# Patient Record
Sex: Female | Born: 1990 | Race: White | Hispanic: No | Smoking: Current every day smoker
Health system: Southern US, Community
[De-identification: ages and names within clinical notes are randomized; demographics above are authoritative.]

## PROBLEM LIST (undated history)

## (undated) DIAGNOSIS — F1990 Other psychoactive substance use, unspecified, uncomplicated: Secondary | ICD-10-CM

## (undated) DIAGNOSIS — Z8709 Personal history of other diseases of the respiratory system: Secondary | ICD-10-CM

## (undated) DIAGNOSIS — D649 Anemia, unspecified: Secondary | ICD-10-CM

## (undated) DIAGNOSIS — Z87440 Personal history of urinary (tract) infections: Secondary | ICD-10-CM

## (undated) DIAGNOSIS — Z87898 Personal history of other specified conditions: Secondary | ICD-10-CM

## (undated) DIAGNOSIS — F909 Attention-deficit hyperactivity disorder, unspecified type: Secondary | ICD-10-CM

## (undated) HISTORY — PX: WISDOM TOOTH EXTRACTION: SHX21

## (undated) HISTORY — DX: Personal history of other diseases of the respiratory system: Z87.09

## (undated) HISTORY — DX: Personal history of other specified conditions: Z87.898

## (undated) HISTORY — DX: Personal history of urinary (tract) infections: Z87.440

---
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
## 2008-01-06 ENCOUNTER — Inpatient Hospital Stay (HOSPITAL_COMMUNITY): Admission: AD | Admit: 2008-01-06 | Discharge: 2008-01-06 | Payer: Self-pay | Admitting: Obstetrics and Gynecology

## 2008-01-21 ENCOUNTER — Emergency Department (HOSPITAL_COMMUNITY): Admission: EM | Admit: 2008-01-21 | Discharge: 2008-01-22 | Payer: Self-pay | Admitting: Emergency Medicine

## 2010-01-07 ENCOUNTER — Inpatient Hospital Stay (HOSPITAL_COMMUNITY): Admission: AD | Admit: 2010-01-07 | Discharge: 2010-01-07 | Payer: Self-pay | Admitting: Obstetrics and Gynecology

## 2010-01-09 ENCOUNTER — Inpatient Hospital Stay (HOSPITAL_COMMUNITY): Admission: AD | Admit: 2010-01-09 | Discharge: 2010-01-10 | Payer: Self-pay | Admitting: Obstetrics and Gynecology

## 2010-08-10 LAB — CBC
Hemoglobin: 10.4 g/dL — ABNORMAL LOW (ref 12.0–15.0)
MCH: 32 pg (ref 26.0–34.0)
MCH: 32.4 pg (ref 26.0–34.0)
MCHC: 34 g/dL (ref 30.0–36.0)
MCV: 93.6 fL (ref 78.0–100.0)
Platelets: 176 10*3/uL (ref 150–400)
Platelets: 192 10*3/uL (ref 150–400)
RBC: 3.84 MIL/uL — ABNORMAL LOW (ref 3.87–5.11)
RDW: 13 % (ref 11.5–15.5)
WBC: 9.7 10*3/uL (ref 4.0–10.5)

## 2010-08-10 LAB — RPR: RPR Ser Ql: NONREACTIVE

## 2010-10-01 ENCOUNTER — Emergency Department (HOSPITAL_COMMUNITY)
Admission: EM | Admit: 2010-10-01 | Discharge: 2010-10-01 | Disposition: A | Discharge status: Home / Self Care | Payer: Medicaid Other | Attending: Emergency Medicine | Admitting: Emergency Medicine

## 2010-10-01 ENCOUNTER — Emergency Department (HOSPITAL_COMMUNITY): Payer: Medicaid Other

## 2010-10-01 DIAGNOSIS — N12 Tubulo-interstitial nephritis, not specified as acute or chronic: Secondary | ICD-10-CM | POA: Insufficient documentation

## 2010-10-01 DIAGNOSIS — A499 Bacterial infection, unspecified: Secondary | ICD-10-CM | POA: Insufficient documentation

## 2010-10-01 DIAGNOSIS — R1031 Right lower quadrant pain: Secondary | ICD-10-CM | POA: Insufficient documentation

## 2010-10-01 DIAGNOSIS — N760 Acute vaginitis: Secondary | ICD-10-CM | POA: Insufficient documentation

## 2010-10-01 DIAGNOSIS — B9689 Other specified bacterial agents as the cause of diseases classified elsewhere: Secondary | ICD-10-CM | POA: Insufficient documentation

## 2010-10-01 LAB — POCT I-STAT, CHEM 8
BUN: 9 mg/dL (ref 6–23)
Chloride: 107 mEq/L (ref 96–112)
Creatinine, Ser: 0.8 mg/dL (ref 0.4–1.2)
Glucose, Bld: 95 mg/dL (ref 70–99)
Hemoglobin: 14.3 g/dL (ref 12.0–15.0)
Potassium: 4.3 mEq/L (ref 3.5–5.1)
Sodium: 139 mEq/L (ref 135–145)

## 2010-10-01 LAB — CBC
HCT: 40.3 % (ref 36.0–46.0)
Hemoglobin: 13.6 g/dL (ref 12.0–15.0)
MCHC: 33.7 g/dL (ref 30.0–36.0)
MCV: 88.4 fL (ref 78.0–100.0)

## 2010-10-01 LAB — WET PREP, GENITAL: Trich, Wet Prep: NONE SEEN

## 2010-10-01 LAB — URINALYSIS, ROUTINE W REFLEX MICROSCOPIC
Bilirubin Urine: NEGATIVE
Ketones, ur: NEGATIVE mg/dL
Nitrite: NEGATIVE
Protein, ur: NEGATIVE mg/dL
Urobilinogen, UA: 1 mg/dL (ref 0.0–1.0)

## 2010-10-01 LAB — DIFFERENTIAL
Basophils Absolute: 0 10*3/uL (ref 0.0–0.1)
Lymphocytes Relative: 12 % (ref 12–46)
Lymphs Abs: 2.1 10*3/uL (ref 0.7–4.0)
Monocytes Absolute: 0.9 10*3/uL (ref 0.1–1.0)
Neutro Abs: 15.2 10*3/uL — ABNORMAL HIGH (ref 1.7–7.7)

## 2010-10-01 LAB — POCT PREGNANCY, URINE: Preg Test, Ur: NEGATIVE

## 2010-10-01 MED ORDER — IOHEXOL 300 MG/ML  SOLN
100.0000 mL | Freq: Once | INTRAMUSCULAR | Status: AC | PRN
  Administered 2010-10-01: 100 mL via INTRAVENOUS

## 2010-10-02 LAB — GC/CHLAMYDIA PROBE AMP, GENITAL
Chlamydia, DNA Probe: NEGATIVE
GC Probe Amp, Genital: NEGATIVE
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
## 2011-02-22 LAB — URINALYSIS, ROUTINE W REFLEX MICROSCOPIC
Bilirubin Urine: NEGATIVE
Ketones, ur: NEGATIVE
Nitrite: POSITIVE — AB
Specific Gravity, Urine: 1.015
Urobilinogen, UA: 2 — ABNORMAL HIGH

## 2011-02-22 LAB — URINE CULTURE: Colony Count: >=100000

## 2011-02-22 LAB — CBC
MCHC: 33.3
MCV: 90.9
Platelets: 246
RDW: 12.9

## 2011-02-22 LAB — GC/CHLAMYDIA PROBE AMP, GENITAL: GC Probe Amp, Genital: NEGATIVE

## 2011-02-22 LAB — POCT PREGNANCY, URINE: Preg Test, Ur: NEGATIVE

## 2011-03-18 ENCOUNTER — Emergency Department (HOSPITAL_COMMUNITY)
Admission: EM | Admit: 2011-03-18 | Discharge: 2011-03-18 | Disposition: A | Discharge status: Home / Self Care | Payer: Medicaid Other | Attending: Emergency Medicine | Admitting: Emergency Medicine

## 2011-03-18 DIAGNOSIS — R110 Nausea: Secondary | ICD-10-CM | POA: Insufficient documentation

## 2011-03-18 DIAGNOSIS — N72 Inflammatory disease of cervix uteri: Secondary | ICD-10-CM | POA: Insufficient documentation

## 2011-03-18 DIAGNOSIS — R300 Dysuria: Secondary | ICD-10-CM | POA: Insufficient documentation

## 2011-03-18 LAB — URINALYSIS, ROUTINE W REFLEX MICROSCOPIC
Glucose, UA: NEGATIVE mg/dL
Protein, ur: NEGATIVE mg/dL
pH: 6.5 (ref 5.0–8.0)

## 2011-03-18 LAB — WET PREP, GENITAL

## 2011-03-18 LAB — URINE MICROSCOPIC-ADD ON

## 2013-03-16 ENCOUNTER — Encounter: Payer: Self-pay | Admitting: Family Medicine

## 2013-03-16 ENCOUNTER — Ambulatory Visit (INDEPENDENT_AMBULATORY_CARE_PROVIDER_SITE_OTHER): Payer: BC Managed Care – PPO | Admitting: Family Medicine

## 2013-03-16 VITALS — BP 104/68 | HR 62 | Temp 98.1°F | Ht 65.25 in | Wt 172.0 lb

## 2013-03-16 DIAGNOSIS — R1013 Epigastric pain: Secondary | ICD-10-CM

## 2013-03-16 DIAGNOSIS — Z23 Encounter for immunization: Secondary | ICD-10-CM

## 2013-03-16 DIAGNOSIS — R300 Dysuria: Secondary | ICD-10-CM | POA: Insufficient documentation

## 2013-03-16 DIAGNOSIS — N926 Irregular menstruation, unspecified: Secondary | ICD-10-CM

## 2013-03-16 LAB — POCT UA - MICROSCOPIC ONLY: Casts, Ur, LPF, POC: 0

## 2013-03-16 LAB — POCT URINALYSIS DIPSTICK: pH, UA: 7

## 2013-03-16 NOTE — Assessment & Plan Note (Signed)
desc as burning  Mild tenderness on exam Lab today incl cmet/cbc/ panc enzymes Trial of zantac 150 otc  Update  ? Consider abd [place] if not imp

## 2013-03-16 NOTE — Assessment & Plan Note (Signed)
Sounds like she has a short cycle - 21 days  Pt does not want to be on OC  Adv return to her gyn/midwife for annual exam  Neg u preg today

## 2013-03-16 NOTE — Patient Instructions (Addendum)
We will culture your urine and see if there is infection  Gradually cut out soda and drink more water  Start zantac 150 mg over the counter 1 pill twice daily for your upper abdominal pain  Labs today for abdominal pain Will update you with results and make a plan  Immunizations today - Tdap and a flu vaccine

## 2013-03-16 NOTE — Progress Notes (Signed)
Subjective:    Patient ID: Alicia Pruitt, female    DOB: 11/19/1990, 22 y.o.   MRN: [id number]  HPI Here to est care   Has obgyn- at [place] - had midwifes  Has not had insurance for a while   Last pap was about 3 years ago  No abn paps  Her mother had hx of ? Cervical cancer  No personal hx of std  She had the HPV shots in HS She does not want a pap or gyn exam today  Is not trying to get pregnant currently  Will in the future  Has been starting her period a week early for several months - and it was shorter than usual Urine preg test test was neg today  Using condoms for birth control -not interested in OC (and had iud - and it caused utis)  Has frequent urination - with urgency No dysuria A lot of abdominal pain - usually low in abdomen-now some burning in epigastric area  No change with eating  A lot of nausea but no vomiting (more so in the am)  No constipation or diarrhea or blood in stool   ? 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  Past Medical History  Diagnosis Date  . History of asthma     as a child  . History of headache   . History of hay fever   . History of UTI    No past surgical history on file. History  Substance Use Topics  . Smoking status: Former Games developer  . Smokeless tobacco: Former Neurosurgeon    Quit date: 09/12/2010  . Alcohol Use: Yes     Comment: occ   Family History  Problem Relation Age of Onset  . Alcohol abuse Paternal Grandfather   . Alcohol abuse Paternal Grandmother   . Drug abuse Other     maternal aunts had drug problems  . Arthritis Paternal  Grandfather     RA  . Colon cancer Other     maternal Great Aunt, Maternal [address]  . Breast cancer Maternal Grandmother   . Breast cancer Maternal Aunt   . Hypertension Maternal Grandmother   . Hypertension Father   . Diabetes type I Brother    No Known Allergies No current outpatient prescriptions on file prior to visit.   No current facility-administered medications on file prior to visit.    Review of Systems Review of Systems  Constitutional: Negative for fever, appetite change, fatigue and unexpected weight change.  Eyes: Negative for pain and visual disturbance.  Respiratory: Negative for cough and shortness of breath.   Cardiovascular: Negative for cp or palpitations    Gastrointestinal: Negative for nausea, diarrhea and constipation. pos for epigastric burning  Genitourinary: Negative for urgency pos for  frequency. neg for hematuria or flank pain neg for vag discharte  Skin: Negative for pallor or rash   Neurological: Negative for weakness, light-headedness, numbness and headaches.  Hematological: Negative for adenopathy. Does not bruise/bleed easily.  Psychiatric/Behavioral: Negative for dysphoric mood. The patient is not nervous/anxious.         Objective:   Physical Exam  Constitutional: She appears well-developed and well-nourished. No distress.  overwt and well appearing   HENT:  Head: Normocephalic and atraumatic.  Right Ear: External ear normal.  Left Ear: External ear normal.  Nose: Nose normal.  Mouth/Throat: Oropharynx is clear and moist.  Nares are boggy  Eyes: Conjunctivae and EOM are normal. Pupils are equal, round, and reactive to light. Right eye exhibits no discharge. Left eye exhibits no discharge. No scleral icterus.  Neck: Normal range of motion. Neck supple. No JVD present. No thyromegaly present.  Cardiovascular: Normal rate, regular rhythm, normal heart sounds and intact distal pulses.  Exam reveals no gallop.   Pulmonary/Chest: Effort  normal and breath sounds normal. No respiratory distress. She has no wheezes. She has no rales.  Abdominal: Soft. Bowel sounds are normal. She exhibits no distension and no mass. There is no hepatosplenomegaly. There is tenderness in the epigastric area. There is no rigidity, no rebound, no guarding, no CVA tenderness, no tenderness at McBurney's point and negative Murphy's sign.  Very mild epigastric tenderness  No suprapubic tenderness or fullness    Musculoskeletal: She exhibits no edema and no tenderness.  Lymphadenopathy:    She has no cervical adenopathy.  Neurological: She is alert. She has normal reflexes. No cranial nerve deficit. She exhibits normal muscle tone. Coordination normal.  Skin: Skin is warm and dry. No rash noted. No erythema. No pallor.  Psychiatric: She has a normal mood and affect.          Assessment & Plan:

## 2013-03-16 NOTE — Assessment & Plan Note (Signed)
ua is borderline for some rbc and wbc  Sent for culture Adv to gradually get off soda and increase water intake  Pend cx result

## 2013-03-17 LAB — COMPREHENSIVE METABOLIC PANEL
AST: 15 U/L (ref 0–37)
Alkaline Phosphatase: 63 U/L (ref 39–117)
BUN: 10 mg/dL (ref 6–23)
Calcium: 9.9 mg/dL (ref 8.4–10.5)
Creatinine, Ser: 0.8 mg/dL (ref 0.4–1.2)
Sodium: 141 mEq/L (ref 135–145)

## 2013-03-17 LAB — CBC WITH DIFFERENTIAL/PLATELET
Basophils Relative: 0.3 % (ref 0.0–3.0)
Eosinophils Absolute: 0.1 10*3/uL (ref 0.0–0.7)
Hemoglobin: 14 g/dL (ref 12.0–15.0)
MCHC: 33.9 g/dL (ref 30.0–36.0)
MCV: 91 fl (ref 78.0–100.0)
Monocytes Absolute: 0.4 10*3/uL (ref 0.1–1.0)
Monocytes Relative: 4.7 % (ref 3.0–12.0)
Neutro Abs: 5.7 10*3/uL (ref 1.4–7.7)
RBC: 4.54 Mil/uL (ref 3.87–5.11)
WBC: 9 10*3/uL (ref 4.5–10.5)

## 2013-03-17 LAB — LIPASE: Lipase: 26 U/L (ref 11.0–59.0)

## 2013-03-18 LAB — URINE CULTURE

## 2013-03-19 ENCOUNTER — Telehealth: Payer: Self-pay | Admitting: Family Medicine

## 2013-03-19 DIAGNOSIS — R300 Dysuria: Secondary | ICD-10-CM

## 2013-03-19 NOTE — Telephone Encounter (Signed)
Message copied by Judy Pimple on Fri Mar 19, 2013  1:35 PM ------      Message from: Shon Millet      Created: Fri Mar 19, 2013 10:32 AM       Pt notified of lab results and urine cx. Pt said she is still having stomach pain and the sensation that she has to urinate but when she uses the bathroom it's very little coming out, sxs are about the same as when she came to her appt, please advise ------

## 2013-03-19 NOTE — Telephone Encounter (Signed)
Ok- I am going to do a urology ref for her so they can try to find out what is causing all this Drink lots of water and avoid other beverages  Ref done- she will get a call

## 2013-03-19 NOTE — Telephone Encounter (Signed)
Pt advised referral done and is okay with seeing urologist, I advise pt Shirlee Limerick will call to get appt scheduled

## 2014-05-06 ENCOUNTER — Encounter: Payer: BC Managed Care – PPO | Admitting: Internal Medicine

## 2014-08-10 ENCOUNTER — Ambulatory Visit (INDEPENDENT_AMBULATORY_CARE_PROVIDER_SITE_OTHER)
Admission: RE | Admit: 2014-08-10 | Discharge: 2014-08-10 | Disposition: A | Discharge status: Home / Self Care | Payer: BLUE CROSS/BLUE SHIELD | Source: Ambulatory Visit | Attending: Family Medicine | Admitting: Family Medicine

## 2014-08-10 ENCOUNTER — Telehealth: Payer: Self-pay

## 2014-08-10 ENCOUNTER — Ambulatory Visit (INDEPENDENT_AMBULATORY_CARE_PROVIDER_SITE_OTHER): Payer: BLUE CROSS/BLUE SHIELD | Admitting: Family Medicine

## 2014-08-10 ENCOUNTER — Encounter: Payer: Self-pay | Admitting: Family Medicine

## 2014-08-10 VITALS — BP 112/70 | HR 73 | Temp 98.4°F | Ht 65.25 in | Wt 177.4 lb

## 2014-08-10 DIAGNOSIS — R05 Cough: Secondary | ICD-10-CM

## 2014-08-10 DIAGNOSIS — R059 Cough, unspecified: Secondary | ICD-10-CM

## 2014-08-10 DIAGNOSIS — R0789 Other chest pain: Secondary | ICD-10-CM

## 2014-08-10 MED ORDER — HYDROCODONE-ACETAMINOPHEN 5-325 MG PO TABS
1.0000 | ORAL_TABLET | Freq: Four times a day (QID) | ORAL | Status: DC | PRN
Start: 2014-08-10 — End: 2015-07-05

## 2014-08-10 MED ORDER — NAPROXEN 500 MG PO TABS: 500.0000 mg | ORAL_TABLET | Freq: Two times a day (BID) | ORAL | Status: DC

## 2014-08-10 NOTE — Assessment & Plan Note (Signed)
This is improving from a prior illness  Could have lead to cwp /costochondritis  cxr today  Update if worse  Disc sympt control

## 2014-08-10 NOTE — Assessment & Plan Note (Addendum)
Positional and with deep breaths and also recent cough  xr of ribs and chest  Naproxen 500 bid with food prn vicodin prn severe pain -warned of sedation  Warm/cool compresses Will adv further after films are read   In light of nausea- also disc poss gallbladder symptoms - doubtful but will keep in mind if no imp/ consider [place]  Of note-neg urine preg tests at home and just finished menses (using condoms for contraception)

## 2014-08-10 NOTE — Telephone Encounter (Signed)
Patient aware of xray results and recommendations. 

## 2014-08-10 NOTE — Progress Notes (Signed)
Subjective:    Patient ID: Alicia Pruitt, female    DOB: 03/24/1991, 24 y.o.   MRN: [id number]  HPI Here for 3 weeks of pain under R breast  Hurts to take deep breath or turn over in bed  Is tender to the touch  It radiates to her back on that side   Also had a cough while out of town - in Jan - still coughing a little  Prod of green phlegm  No fever  No sob  No other resp symptoms   A little nausea - a month  Neg preg tests are neg   Has a bump on her back that looks like a pimple   Tried motrin 400  Helped a bit  Took one of her grandmother's vicodin -helpful but does not like to take it   Patient Active Problem List   Diagnosis Date Noted  . Right-sided chest wall pain 08/10/2014  . Cough 08/10/2014  . Dysuria 03/16/2013  . Abdominal pain, epigastric 03/16/2013  . Irregular menses 03/16/2013   Past Medical History  Diagnosis Date  . History of asthma     as a child  . History of headache   . History of hay fever   . History of UTI    No past surgical history on file. History  Substance Use Topics  . Smoking status: Former Games developermoker  . Smokeless tobacco: Former NeurosurgeonUser    Quit date: 09/12/2010  . Alcohol Use: Yes     Comment: occ   Family History  Problem Relation Age of Onset  . Alcohol abuse Paternal Grandfather   . Alcohol abuse Paternal Grandmother   . Drug abuse Other     maternal aunts had drug problems  . Arthritis Paternal Grandfather     RA  . Colon cancer Other     maternal Great Aunt, Maternal [address] Uncle  . Breast cancer Maternal Grandmother   . Breast cancer Maternal Aunt   . Hypertension Maternal Grandmother   . Hypertension Father   . Diabetes type I Brother    Allergies  Allergen Reactions  . Tramadol Nausea And Vomiting   Current Outpatient Prescriptions on File Prior to Visit  Medication Sig Dispense Refill  . Multiple Vitamins-Minerals (MULTIVITAL) tablet Take 1 tablet by mouth daily.     No current facility-administered  medications on file prior to visit.     Review of Systems Review of Systems  Constitutional: Negative for fever, appetite change, fatigue and unexpected weight change.  Eyes: Negative for pain and visual disturbance.  Respiratory: Negative for wheeze  and shortness of breath.  pos for R sided chest wall/rib pain  Cardiovascular: Negative for cp or palpitations    Gastrointestinal: Negative for , diarrhea and constipation. neg for epigastric pain or vomiting  Genitourinary: Negative for urgency and frequency. neg for hematuria  Skin: Negative for pallor or rash   Neurological: Negative for weakness, light-headedness, numbness and headaches.  Hematological: Negative for adenopathy. Does not bruise/bleed easily.  Psychiatric/Behavioral: Negative for dysphoric mood. The patient is not nervous/anxious.         Objective:   Physical Exam  Constitutional: She appears well-developed and well-nourished. No distress.  overwt and well app  HENT:  Head: Normocephalic and atraumatic.  Mouth/Throat: Oropharynx is clear and moist.  Eyes: Conjunctivae and EOM are normal. Pupils are equal, round, and reactive to light. Right eye exhibits no discharge. Left eye exhibits no discharge. No scleral icterus.  Neck: Normal  range of motion. Neck supple. No JVD present.  Cardiovascular: Normal rate, regular rhythm, normal heart sounds and intact distal pulses.   Pulmonary/Chest: Effort normal and breath sounds normal. No respiratory distress. She has no wheezes. She has no rales. She exhibits tenderness.  R anterolat cwp - worse in lower ribs No crepitus or skin change   Pt does have pain with twisting and also deep breath   Abdominal: Soft. Bowel sounds are normal. She exhibits no distension and no mass. There is tenderness. There is no rebound and no guarding.  Mild tenderness under R ribs (but mostly on top of ribs) No epigastric tenderness No cva tenderness   Musculoskeletal: She exhibits no edema or  tenderness.  Lymphadenopathy:    She has no cervical adenopathy.  Neurological: She is alert.  Skin: Skin is warm and dry. No rash noted. No erythema. No pallor.  Resolving area of ecchymosis in L scapular area   Papule on mid low back-unremarkable   Psychiatric: She has a normal mood and affect.          Assessment & Plan:   Problem List Items Addressed This Visit      Other   Cough    This is improving from a prior illness  Could have lead to cwp /costochondritis  cxr today  Update if worse  Disc sympt control       Relevant Orders   DG Chest 2 View   DG Ribs Unilateral Right   Right-sided chest wall pain - Primary    Positional and with deep breaths and also recent cough  xr of ribs and chest  Naproxen 500 bid with food prn vicodin prn severe pain -warned of sedation  Warm/cool compresses Will adv further after films are read   In light of nausea- also disc poss gallbladder symptoms - doubtful but will keep in mind if no imp/ consider [place]  Of note-neg urine preg tests at home and just finished menses (using condoms for contraception)      Relevant Orders   DG Chest 2 View   DG Ribs Unilateral Right

## 2014-08-10 NOTE — Patient Instructions (Signed)
Chest xray today  Also rib xray  We will contact you with a result later today  Take the naproxen as needed for pain with food vicodin for severe breakthrough pain only-watch out for sedation    Update if not starting to improve in a week or if worsening

## 2014-08-10 NOTE — Telephone Encounter (Signed)
-----   Message from Judy PimpleMarne A Tower, MD sent at 08/10/2014  3:10 PM EDT ----- CXR and rib films are normal  So this is probably muscle strain or cartilage inflammation between ribs  Try the naproxen and update me if no improvement in the next week  Also alternate heat and cold compresses

## 2014-08-10 NOTE — Progress Notes (Signed)
Pre visit review using our clinic review tool, if applicable. No additional management support is needed unless otherwise documented below in the visit note. 

## 2015-07-05 ENCOUNTER — Encounter: Payer: Self-pay | Admitting: Family Medicine

## 2015-07-05 ENCOUNTER — Other Ambulatory Visit (HOSPITAL_COMMUNITY)
Admission: RE | Admit: 2015-07-05 | Discharge: 2015-07-05 | Disposition: A | Discharge status: Home / Self Care | Payer: BLUE CROSS/BLUE SHIELD | Source: Ambulatory Visit | Attending: Family Medicine | Admitting: Family Medicine

## 2015-07-05 ENCOUNTER — Ambulatory Visit (INDEPENDENT_AMBULATORY_CARE_PROVIDER_SITE_OTHER): Payer: BLUE CROSS/BLUE SHIELD | Admitting: Family Medicine

## 2015-07-05 VITALS — BP 106/62 | HR 66 | Temp 98.3°F | Ht 65.25 in | Wt 177.5 lb

## 2015-07-05 DIAGNOSIS — Z23 Encounter for immunization: Secondary | ICD-10-CM | POA: Diagnosis not present

## 2015-07-05 DIAGNOSIS — Z01419 Encounter for gynecological examination (general) (routine) without abnormal findings: Secondary | ICD-10-CM | POA: Insufficient documentation

## 2015-07-05 DIAGNOSIS — N920 Excessive and frequent menstruation with regular cycle: Secondary | ICD-10-CM | POA: Diagnosis not present

## 2015-07-05 DIAGNOSIS — Z720 Tobacco use: Secondary | ICD-10-CM

## 2015-07-05 DIAGNOSIS — R5383 Other fatigue: Secondary | ICD-10-CM | POA: Insufficient documentation

## 2015-07-05 DIAGNOSIS — F17200 Nicotine dependence, unspecified, uncomplicated: Secondary | ICD-10-CM

## 2015-07-05 DIAGNOSIS — R5382 Chronic fatigue, unspecified: Secondary | ICD-10-CM

## 2015-07-05 LAB — CBC WITH DIFFERENTIAL/PLATELET
Basophils Absolute: 0 10*3/uL (ref 0.0–0.1)
Basophils Relative: 0.4 % (ref 0.0–3.0)
Eosinophils Absolute: 0.1 10*3/uL (ref 0.0–0.7)
Eosinophils Relative: 1.1 % (ref 0.0–5.0)
HCT: 42.5 % (ref 36.0–46.0)
Hemoglobin: 14.1 g/dL (ref 12.0–15.0)
Lymphocytes Relative: 32.4 % (ref 12.0–46.0)
Lymphs Abs: 3 10*3/uL (ref 0.7–4.0)
MCHC: 33 g/dL (ref 30.0–36.0)
MCV: 92 fl (ref 78.0–100.0)
Monocytes Absolute: 0.6 10*3/uL (ref 0.1–1.0)
Monocytes Relative: 6.8 % (ref 3.0–12.0)
Neutro Abs: 5.4 10*3/uL (ref 1.4–7.7)
Neutrophils Relative %: 59.3 % (ref 43.0–77.0)
Platelets: 252 10*3/uL (ref 150.0–400.0)
RBC: 4.62 Mil/uL (ref 3.87–5.11)
RDW: 13.5 % (ref 11.5–15.5)
WBC: 9.1 10*3/uL (ref 4.0–10.5)

## 2015-07-05 LAB — HCG, SERUM, QUALITATIVE: Preg, Serum: NEGATIVE

## 2015-07-05 LAB — TSH: TSH: 0.48 u[IU]/mL (ref 0.35–4.50)

## 2015-07-05 LAB — HM PAP SMEAR: HM Pap smear: NORMAL

## 2015-07-05 NOTE — Progress Notes (Signed)
Subjective:    Patient ID: Alicia Pruitt, female    DOB: [birth year], 25 y.o.   MRN: [id number]  HPI Here with troublesome periods   Last 2 mo - period is short (lasting 2 d)  Blood is clotted on the tampon - very heavy compared to normal --using regular size tampon - and changes 2 times in an hour  Unusual for her   Last birth was 5 years ago   Last gyn visit and pap was years ago - at least 3 years ago  No hx of gyn problems  No abn pap tests  Does not use birth control - seeing what happens  No condoms Monogamous-no concerns about stds   2 kids   Still smokes - 3-4 cig per day  She does want to quit smoking -she is not quite ready   Wt has not changed   Had an IUD in the past - worked ok but then it seemed to predispose her to utis   Is spotting right now   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 Smoking 07/06/2015  . Heavy menses 07/05/2015  . Encounter for routine gynecological examination 07/05/2015  . Fatigue 07/05/2015  . Right-sided chest wall pain 08/10/2014  . Cough 08/10/2014  . Dysuria 03/16/2013  . Abdominal pain, epigastric 03/16/2013  . Irregular menses 03/16/2013   Past Medical History  Diagnosis Date  . History of asthma     as a child  . History of headache   . History of hay fever   . History of UTI    No past surgical history on file. Social History  Substance Use Topics  . Smoking status: Current Every Day Smoker -- 0.10 packs/day    Types: Cigarettes  . Smokeless tobacco: Never Used  . Alcohol Use: 0.0 oz/week    0 Standard drinks or equivalent per week     Comment: occ   Family History  Problem Relation Age of Onset  . Alcohol abuse Paternal Grandfather   . Alcohol abuse Paternal Grandmother   . Drug abuse Other     maternal aunts had drug problems  . Arthritis  Paternal Grandfather     RA  . Colon cancer Other     maternal Great Aunt, Maternal [address]  . Breast cancer Maternal Grandmother   . Breast cancer Maternal Aunt   . Hypertension Maternal Grandmother   . Hypertension Father   . Diabetes type I Brother    Allergies  Allergen Reactions  . Tramadol Nausea And Vomiting   Current Outpatient Prescriptions on File Prior to Visit  Medication Sig Dispense Refill  . Multiple Vitamins-Minerals (MULTIVITAL) tablet Take 1 tablet by mouth daily.    . Probiotic Product (PROBIOTIC DAILY PO) Take by mouth.     No current facility-administered medications on file prior to visit.    Review of Systems Review of Systems  Constitutional: Negative for fever, appetite change, fatigue and unexpected weight change.  Eyes: Negative for pain and visual disturbance.  Respiratory: Negative for cough and shortness of breath.   Cardiovascular: Negative for cp or palpitations    Gastrointestinal: Negative for nausea, diarrhea and constipation.  Genitourinary: Negative for urgency and frequency. pos for change in menses  Skin: Negative for pallor or rash   Neurological:  Negative for weakness, light-headedness, numbness and headaches.  Hematological: Negative for adenopathy. Does not bruise/bleed easily.  Psychiatric/Behavioral: Negative for dysphoric mood. The patient is not nervous/anxious.  pos for moodiness lately        Objective:   Physical Exam  Constitutional: She appears well-developed and well-nourished. No distress.  obese and well appearing   HENT:  Head: Normocephalic and atraumatic.  Right Ear: External ear normal.  Left Ear: External ear normal.  Mouth/Throat: Oropharynx is clear and moist.  Eyes: Conjunctivae and EOM are normal. Pupils are equal, round, and reactive to light. No scleral icterus.  Neck: Normal range of motion. Neck supple. No JVD present. Carotid bruit is not present. No thyromegaly present.  Cardiovascular: Normal  rate, regular rhythm, normal heart sounds and intact distal pulses.  Exam reveals no gallop.   Pulmonary/Chest: Effort normal and breath sounds normal. No respiratory distress. She has no wheezes. She exhibits no tenderness.  Abdominal: Soft. Bowel sounds are normal. She exhibits no distension, no abdominal bruit and no mass. There is no tenderness.  Genitourinary: No breast swelling, tenderness, discharge or bleeding. There is no rash, tenderness or lesion on the right labia. There is no rash, tenderness or lesion on the left labia. Uterus is not enlarged and not tender. Cervix exhibits no motion tenderness and no friability. Right adnexum displays no mass, no tenderness and no fullness. Left adnexum displays no mass, no tenderness and no fullness. No erythema, tenderness or bleeding in the vagina.  Breast exam: No mass, nodules, thickening, tenderness, bulging, retraction, inflamation, nipple discharge or skin changes noted.  No axillary or clavicular LA.      Scant blood at cervical OS  Musculoskeletal: Normal range of motion. She exhibits no edema or tenderness.  Lymphadenopathy:    She has no cervical adenopathy.  Neurological: She is alert. She has normal reflexes. No cranial nerve deficit. She exhibits normal muscle tone. Coordination normal.  Skin: Skin is warm and dry. No rash noted. No erythema. No pallor.  Psychiatric: She has a normal mood and affect.          Assessment & Plan:   Problem List Items Addressed This Visit      Other   Encounter for routine gynecological examination    Pap and exam done  Menses changes disc  Does not desire contraception  Adv PNV if she is not using any      Relevant Orders   Cytology - PAP   Fatigue    In addn to change in menses Suspect change in schedule and stress  Lab today      Relevant Orders   TSH (Completed)   CBC with Differential/Platelet (Completed)   Heavy menses - Primary    Heavy but short  Also short cycle - has  had this in the past  Does not use contraception  Exam and pap today  Lab for cbc/tsh and serum pregnancy test   Suspect due to change in schedule and stress Enc self care  She could try OC to regulate menses if she quits smoking       Relevant Orders   hCG, serum, qualitative (Completed)   Smoking    Disc in detail risks of smoking and possible outcomes including copd, vascular/ heart disease, cancer , respiratory and sinus infections  Pt voices understanding She is a light smoker Not ready to quit currently  Offered help if needed        Other Visit Diagnoses  Need for influenza vaccination        Relevant Orders    Flu Vaccine QUAD 36+ mos PF IM (Fluarix & Fluzone Quad PF) (Completed)

## 2015-07-05 NOTE — Patient Instructions (Addendum)
Pap test and exam today  If you do quit smoking and are interested in oral contraception to control periods- let me know and we can try one  Try to keep sleep regular/ stress low / exercise regular and healthy diet  Take care of yourself   Lab today for thyroid and also a serum pregnancy test and blood count

## 2015-07-05 NOTE — Progress Notes (Signed)
Pre visit review using our clinic review tool, if applicable. No additional management support is needed unless otherwise documented below in the visit note. 

## 2015-07-06 DIAGNOSIS — IMO0001 Reserved for inherently not codable concepts without codable children: Secondary | ICD-10-CM | POA: Insufficient documentation

## 2015-07-06 DIAGNOSIS — F17200 Nicotine dependence, unspecified, uncomplicated: Secondary | ICD-10-CM | POA: Insufficient documentation

## 2015-07-06 NOTE — Assessment & Plan Note (Signed)
In addn to change in menses Suspect change in schedule and stress  Lab today

## 2015-07-06 NOTE — Assessment & Plan Note (Signed)
Disc in detail risks of smoking and possible outcomes including copd, vascular/ heart disease, cancer , respiratory and sinus infections  Pt voices understanding She is a light smoker Not ready to quit currently  Offered help if needed

## 2015-07-06 NOTE — Assessment & Plan Note (Signed)
Heavy but short  Also short cycle - has had this in the past  Does not use contraception  Exam and pap today  Lab for cbc/tsh and serum pregnancy test   Suspect due to change in schedule and stress Enc self care  She could try OC to regulate menses if she quits smoking

## 2015-07-06 NOTE — Assessment & Plan Note (Signed)
Pap and exam done  Menses changes disc  Does not desire contraception  Adv PNV if she is not using any

## 2015-07-10 LAB — CYTOLOGY - PAP

## 2015-07-11 ENCOUNTER — Encounter: Payer: Self-pay | Admitting: *Deleted

## 2015-07-21 IMAGING — CR DG RIBS 2V*R*
4 series · 4 of 4 positions shown · non-contrast
Comparison: 08/10/2014 chest x-ray

CLINICAL DATA: Right chest wall pain.  Cough.

EXAM:
RIGHT RIBS - 2 VIEW

[view not recorded (1 of 4)]
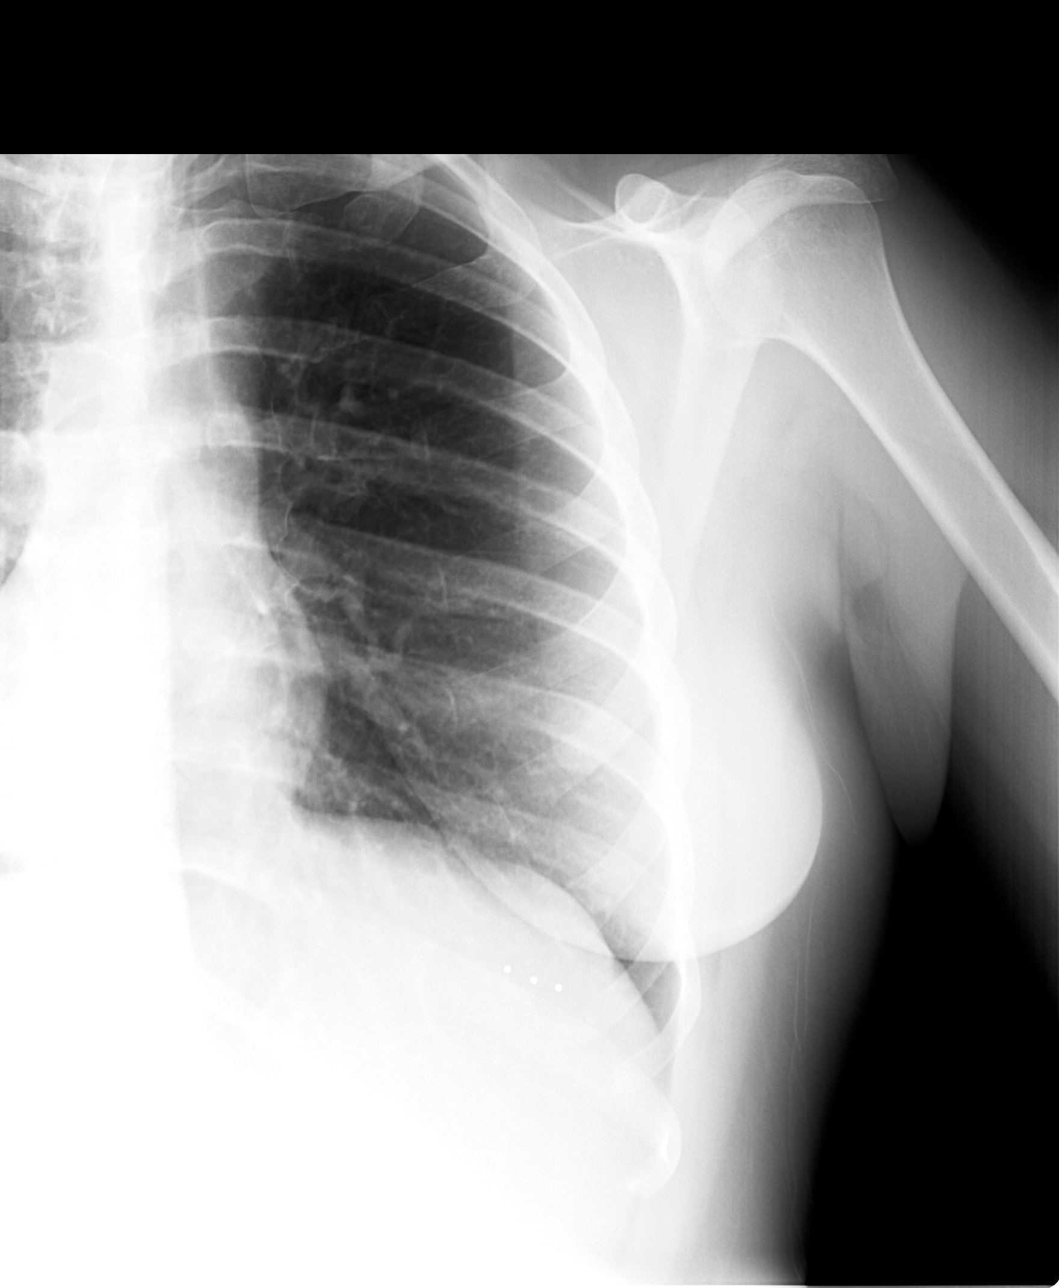

[view not recorded (2 of 4)]
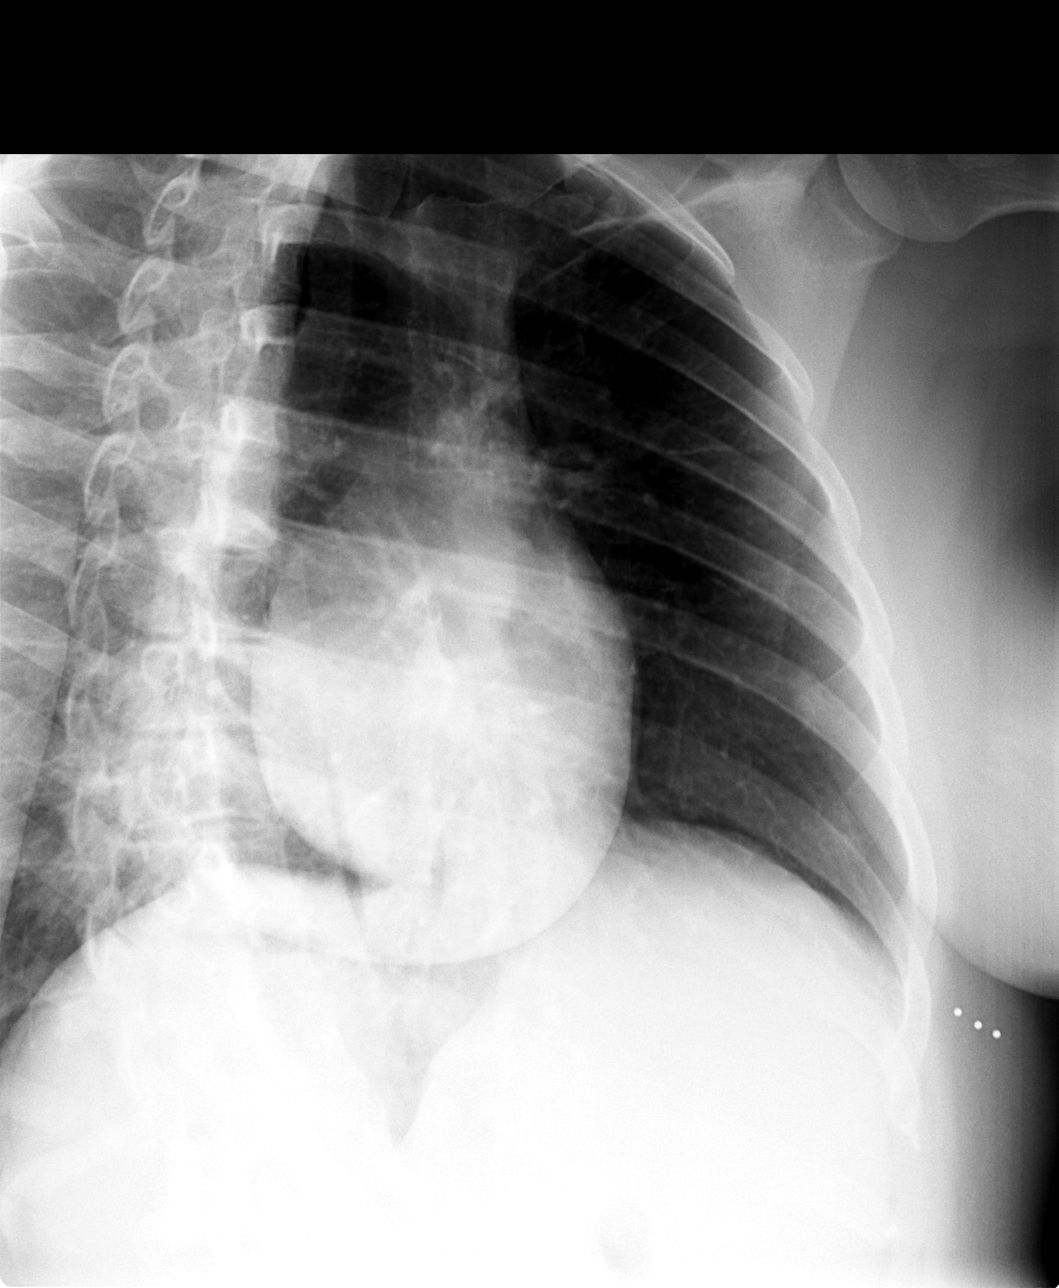

[view not recorded (3 of 4)]
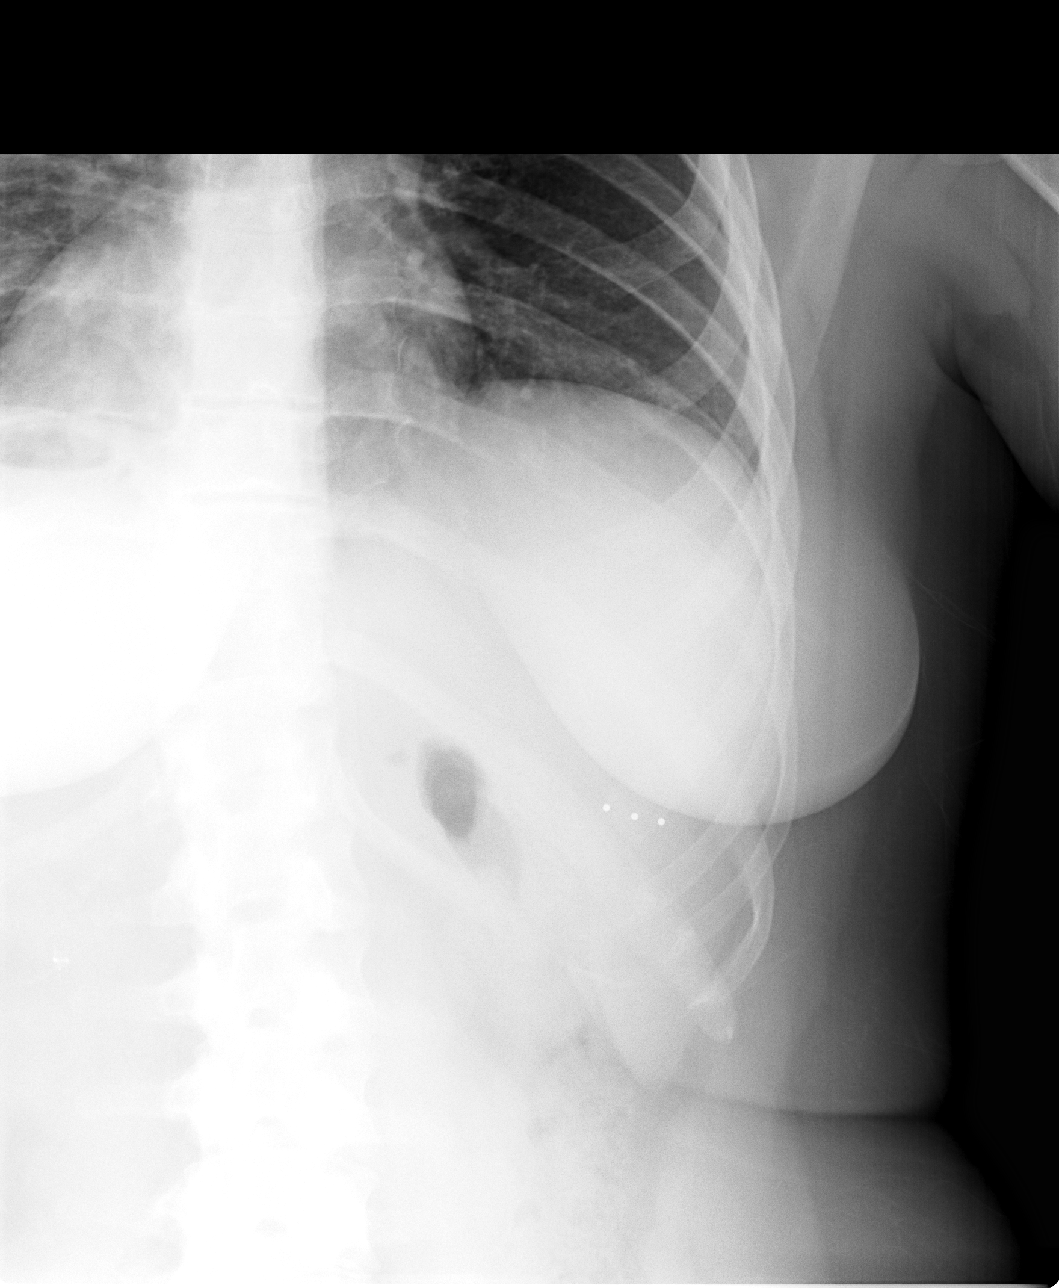

[view not recorded (4 of 4)]
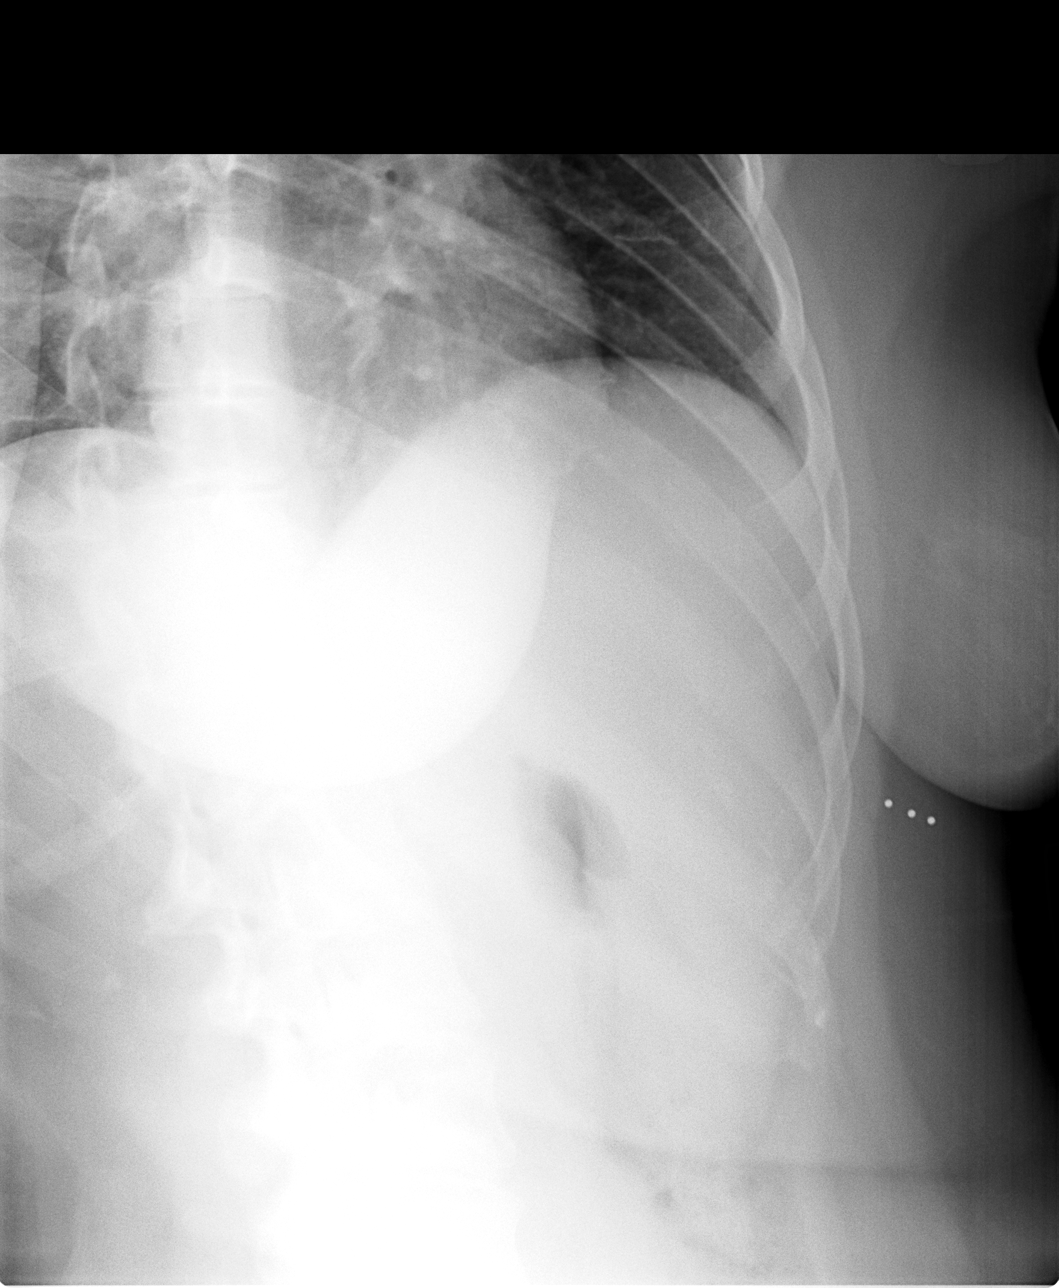

[4 of 4 positions shown; findings below may reference images not displayed]

FINDINGS: No fracture or other bone lesions are seen involving the ribs. Left
lung is clear. No pneumothorax.
IMPRESSION: Negative.

## 2017-12-25 ENCOUNTER — Emergency Department (HOSPITAL_COMMUNITY)
Admission: EM | Admit: 2017-12-25 | Discharge: 2017-12-25 | Disposition: A | Discharge status: Home / Self Care | Payer: BLUE CROSS/BLUE SHIELD | Attending: Emergency Medicine | Admitting: Emergency Medicine

## 2017-12-25 ENCOUNTER — Other Ambulatory Visit: Payer: Self-pay

## 2017-12-25 DIAGNOSIS — Z79899 Other long term (current) drug therapy: Secondary | ICD-10-CM | POA: Insufficient documentation

## 2017-12-25 DIAGNOSIS — F17210 Nicotine dependence, cigarettes, uncomplicated: Secondary | ICD-10-CM | POA: Insufficient documentation

## 2017-12-25 DIAGNOSIS — J45909 Unspecified asthma, uncomplicated: Secondary | ICD-10-CM | POA: Insufficient documentation

## 2017-12-25 DIAGNOSIS — K0889 Other specified disorders of teeth and supporting structures: Secondary | ICD-10-CM

## 2017-12-25 MED ORDER — PENICILLIN V POTASSIUM 500 MG PO TABS: 500.0000 mg | ORAL_TABLET | Freq: Three times a day (TID) | ORAL | 0 refills | Status: DC

## 2017-12-25 MED ORDER — GABAPENTIN 100 MG PO CAPS: 100.0000 mg | ORAL_CAPSULE | Freq: Three times a day (TID) | ORAL | 0 refills | Status: DC

## 2017-12-25 NOTE — ED Provider Notes (Signed)
[facility] EMERGENCY DEPARTMENT Provider Note   CSN: [id number] Arrival date & time: 12/25/17  2118     History   Chief Complaint Chief Complaint  Patient presents with  . Dental Pain    HPI Alicia Pruitt is a 27 y.o. female.  The history is provided by the patient. No language interpreter was used.  Dental Pain       27 year old female presented for evaluation of dental pain.  Patient report for the past 2 weeks she has had persistent pain to the right lower tooth.  She described pain as a achy sensation, moderate in severity, with associated facial swelling.  She report that a tooth that broke off shortly before her pain started.  In the meantime, patient have been taking her friend's leftover Bactrim for total of 6 doses.  She also took 1 pill of tramadol, and also to some gabapentin from her dad which did provide some relief.  She has been taking ibuprofen as well with minimal improvement.  She does not have dental care time.  She denies any specific injury.  No fever, trouble swallowing, or neck pain.    Past Medical History:  Diagnosis Date  . History of asthma    as a child  . History of hay fever   . History of headache   . History of UTI     Patient Active Problem List   Diagnosis Date Noted  . Smoking 07/06/2015  . Heavy menses 07/05/2015  . Encounter for routine gynecological examination 07/05/2015  . Fatigue 07/05/2015  . Right-sided chest wall pain 08/10/2014  . Cough 08/10/2014  . Dysuria 03/16/2013  . Abdominal pain, epigastric 03/16/2013  . Irregular menses 03/16/2013    The histories are not reviewed yet. Please review them in the "History" navigator section and refresh this SmartLink.   OB History   None      Home Medications    Prior to Admission medications   Medication Sig Start Date End Date Taking? Authorizing Provider  Multiple Vitamins-Minerals (MULTIVITAL) tablet Take 1 tablet by mouth daily.    [provider]  Probiotic Product (PROBIOTIC DAILY PO) Take by mouth.    [provider]    Family History Family History  Problem Relation Age of Onset  . Alcohol abuse Paternal Grandfather   . Alcohol abuse Paternal Grandmother   . Drug abuse Other        maternal aunts had drug problems  . Arthritis Paternal Grandfather        RA  . Colon cancer Other        maternal Great Aunt, Maternal [address]  . Breast cancer Maternal Grandmother   . Breast cancer Maternal Aunt   . Hypertension Maternal Grandmother   . Hypertension Father   . Diabetes type I Brother     Social History Social History   Tobacco Use  . Smoking status: Current Every Day Smoker    Packs/day: 0.10    Types: Cigarettes  . Smokeless tobacco: Never Used  Substance Use Topics  . Alcohol use: Yes    Alcohol/week: 0.0 oz    Comment: occ  . Drug use: No     Allergies   Tramadol   Review of Systems Review of Systems  Constitutional: Negative for fever.  HENT: Positive for dental problem. Negative for trouble swallowing.      Physical Exam Updated Vital Signs BP 133/84 (BP Location: Right Arm)  Pulse 96   Temp 98.2 F (36.8 C)   Ht 5\' 6"  (1.676 m)   Wt 72.6 kg (160 lb)   SpO2 98%   BMI 25.82 kg/m   Physical Exam  Constitutional: She appears well-developed and well-nourished. No distress.  HENT:  Head: Atraumatic.  Mouth: Dental decay noted to tooth #28 with tenderness to palpation no gingival involvement and no facial swelling appreciated.  Eyes: Conjunctivae are normal.  Neck: Neck supple.  Neurological: She is alert.  Skin: No rash noted.  Psychiatric: She has a normal mood and affect.  Nursing note and vitals reviewed.    ED Treatments / Results  Labs (all labs ordered are listed, but only abnormal results are displayed) Labs Reviewed - No data to display  EKG None  Radiology No results found.  Procedures Procedures (including critical care  time)  Medications Ordered in ED Medications - No data to display   Initial Impression / Assessment and Plan / ED Course  I have reviewed the triage vital signs and the nursing notes.  Pertinent labs & imaging results that were available during my care of the patient were reviewed by me and considered in my medical decision making (see chart for details).     BP 133/84 (BP Location: Right Arm)   Pulse 96   Temp 98.2 F (36.8 C)   Ht 5\' 6"  (1.676 m)   Wt 72.6 kg (160 lb)   SpO2 98%   BMI 25.82 kg/m    Final Clinical Impressions(s) / ED Diagnoses   Final diagnoses:  Pain, dental    ED Discharge Orders        Ordered    penicillin v potassium (VEETID) 500 MG tablet  3 times daily     12/25/17 2222    gabapentin (NEURONTIN) 100 MG capsule  3 times daily     12/25/17 2222     10:18 PM Patient here with dental pain.  No significant abscess appreciated.  No evidence of deep tissue infection.  Patient reports taking Bactrim recently but only had 3 days worth.  Will transition to penicillin.  Patient also reports gabapentin seems to help with her pain.  Will provide short course of gabapentin and will give dental referral.   Fayrene Helperran, Khiya Friese, PA-C 12/25/17 2222    Mesner, Barbara CowerJason, MD 12/26/17 [phone]

## 2017-12-25 NOTE — ED Triage Notes (Signed)
Per pt she has had a tooth that broke on the right lower bottom and has been hurting for 2 weeks. Pt stated her face was very swollen but had taken some ones antibiotics and it has made the swelling go down. Denies no fevers. Alert oriented x 4

## 2018-11-04 ENCOUNTER — Emergency Department (HOSPITAL_COMMUNITY)
Admission: EM | Admit: 2018-11-04 | Discharge: 2018-11-04 | Disposition: A | Discharge status: Home / Self Care | Payer: Self-pay | Attending: Emergency Medicine | Admitting: Emergency Medicine

## 2018-11-04 ENCOUNTER — Other Ambulatory Visit: Payer: Self-pay

## 2018-11-04 ENCOUNTER — Ambulatory Visit (HOSPITAL_COMMUNITY)
Admission: EM | Admit: 2018-11-04 | Discharge: 2018-11-04 | Disposition: A | Discharge status: Home / Self Care | Payer: No Typology Code available for payment source | Attending: Emergency Medicine | Admitting: Emergency Medicine

## 2018-11-04 ENCOUNTER — Encounter (HOSPITAL_COMMUNITY): Payer: Self-pay | Admitting: *Deleted

## 2018-11-04 DIAGNOSIS — J45909 Unspecified asthma, uncomplicated: Secondary | ICD-10-CM | POA: Insufficient documentation

## 2018-11-04 DIAGNOSIS — T7421XA Adult sexual abuse, confirmed, initial encounter: Secondary | ICD-10-CM | POA: Insufficient documentation

## 2018-11-04 DIAGNOSIS — F17210 Nicotine dependence, cigarettes, uncomplicated: Secondary | ICD-10-CM | POA: Insufficient documentation

## 2018-11-04 DIAGNOSIS — Z0441 Encounter for examination and observation following alleged adult rape: Secondary | ICD-10-CM | POA: Insufficient documentation

## 2018-11-04 DIAGNOSIS — Z79899 Other long term (current) drug therapy: Secondary | ICD-10-CM | POA: Insufficient documentation

## 2018-11-04 MED ORDER — CEFTRIAXONE SODIUM 250 MG IJ SOLR
250.0000 mg | Freq: Once | INTRAMUSCULAR | Status: AC
  Administered 2018-11-04: 250 mg via INTRAMUSCULAR
  Filled 2018-11-04: qty 250

## 2018-11-04 MED ORDER — AZITHROMYCIN 250 MG PO TABS
1000.0000 mg | ORAL_TABLET | Freq: Once | ORAL | Status: AC
  Administered 2018-11-04: 1000 mg via ORAL
  Filled 2018-11-04: qty 4

## 2018-11-04 MED ORDER — METRONIDAZOLE 500 MG PO TABS
2000.0000 mg | ORAL_TABLET | Freq: Once | ORAL | Status: AC
  Administered 2018-11-04: 2000 mg via ORAL
  Filled 2018-11-04: qty 4

## 2018-11-04 MED ORDER — LIDOCAINE HCL (PF) 1 % IJ SOLN
0.9000 mL | Freq: Once | INTRAMUSCULAR | Status: AC
  Administered 2018-11-04: 0.9 mL
  Filled 2018-11-04: qty 30

## 2018-11-04 MED ORDER — ONDANSETRON 8 MG PO TBDP: ORAL_TABLET | ORAL | 0 refills | Status: DC
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
## 2018-11-04 NOTE — ED Notes (Signed)
Bed: WA03 Expected date:  Expected time:  Means of arrival:  Comments: Closing section
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
## 2018-11-04 NOTE — ED Notes (Signed)
Called Sane RN Dawn@[phone] 
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
## 2018-11-04 NOTE — SANE Note (Signed)
FNE spoke to patient about STI prophylaxis including HIV prophylaxis.  Patient was informed of side effects and potential blood work that may be needed.  Patient opted for all STI prophylaxis except HIV.  FNE also spoke to patient about pregnancy prevention.  Patient opted not to take pregnancy prevention medication.   Patient is a daily drinker.  FNE explained to patient the adverse effects of mixing alcohol with metronidazole which is a standard STI proplylaxis medication.  Patient stated she could abstain from alcohol for at least 5 days.  As patient was intermittently vomiting during exam, FNE gave all tablets to patient to take at home.  Patient was instructed to take the tablets on Friday and not to partake of any alcohol until at least Monday, November 09, 2018.  Attending physician, Dr. Randal Buba provide patient with a prescription for Zofran to manage any nausea from the medications.

## 2019-02-16 ENCOUNTER — Encounter (HOSPITAL_COMMUNITY): Payer: Self-pay | Admitting: Emergency Medicine

## 2019-02-16 ENCOUNTER — Emergency Department (HOSPITAL_COMMUNITY)
Admission: EM | Admit: 2019-02-16 | Discharge: 2019-02-16 | Discharge status: Against Medical Advice | Payer: No Typology Code available for payment source | Attending: Emergency Medicine | Admitting: Emergency Medicine

## 2019-02-16 ENCOUNTER — Other Ambulatory Visit: Payer: Self-pay

## 2019-02-16 DIAGNOSIS — Z5321 Procedure and treatment not carried out due to patient leaving prior to being seen by health care provider: Secondary | ICD-10-CM | POA: Insufficient documentation

## 2019-02-16 DIAGNOSIS — Z20822 Contact with and (suspected) exposure to covid-19: Secondary | ICD-10-CM

## 2019-02-16 NOTE — ED Notes (Signed)
Called pt for triage no answer X3

## 2019-02-16 NOTE — ED Notes (Signed)
Called pt for triage no answer X3 

## 2019-02-16 NOTE — ED Triage Notes (Signed)
Pt here with possible covid , pt co worker just tested positive pt has loss of smell and taste and h/a , pt is [redacted] weeks pregnant

## 2019-02-17 LAB — NOVEL CORONAVIRUS, NAA: SARS-CoV-2, NAA: NOT DETECTED

## 2021-04-01 ENCOUNTER — Other Ambulatory Visit: Payer: Self-pay

## 2021-04-01 ENCOUNTER — Encounter (HOSPITAL_COMMUNITY): Payer: Self-pay | Admitting: Emergency Medicine

## 2021-04-01 ENCOUNTER — Emergency Department (HOSPITAL_COMMUNITY)
Admission: EM | Admit: 2021-04-01 | Discharge: 2021-04-01 | Discharge status: Against Medical Advice | Payer: Medicaid Other | Attending: Emergency Medicine | Admitting: Emergency Medicine

## 2021-04-01 DIAGNOSIS — R000 Tachycardia, unspecified: Secondary | ICD-10-CM | POA: Diagnosis not present

## 2021-04-01 DIAGNOSIS — T402X1A Poisoning by other opioids, accidental (unintentional), initial encounter: Secondary | ICD-10-CM | POA: Diagnosis present

## 2021-04-01 DIAGNOSIS — F17210 Nicotine dependence, cigarettes, uncomplicated: Secondary | ICD-10-CM | POA: Diagnosis not present

## 2021-04-01 DIAGNOSIS — T40601A Poisoning by unspecified narcotics, accidental (unintentional), initial encounter: Secondary | ICD-10-CM

## 2021-04-01 HISTORY — DX: Other psychoactive substance use, unspecified, uncomplicated: F19.90
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
## 2021-04-01 NOTE — ED Notes (Signed)
PT states she wants to leave. MD aware. IV removed.

## 2021-04-01 NOTE — Discharge Instructions (Signed)
You have chosen to leave the emergency department AGAINST MEDICAL ADVICE.  I have explained  the need for further evaluation/admission.  You have verbalized understanding of these results and plan and are choosing to leave the hospital AGAINST MEDICAL ADVICE. You understand the risks associated with leaving without further evaluation/treatment. If you have any worsening symptoms or choose to be treated please return immediately to emergency department.
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
## 2021-11-05 ENCOUNTER — Ambulatory Visit (INDEPENDENT_AMBULATORY_CARE_PROVIDER_SITE_OTHER): Payer: Medicaid Other

## 2021-11-05 ENCOUNTER — Encounter (HOSPITAL_COMMUNITY): Payer: Self-pay | Admitting: Emergency Medicine

## 2021-11-05 ENCOUNTER — Ambulatory Visit (HOSPITAL_COMMUNITY)
Admission: EM | Admit: 2021-11-05 | Discharge: 2021-11-05 | Disposition: A | Discharge status: Home / Self Care | Payer: Medicaid Other | Attending: Family Medicine | Admitting: Family Medicine

## 2021-11-05 DIAGNOSIS — K921 Melena: Secondary | ICD-10-CM | POA: Insufficient documentation

## 2021-11-05 DIAGNOSIS — K529 Noninfective gastroenteritis and colitis, unspecified: Secondary | ICD-10-CM | POA: Diagnosis not present

## 2021-11-05 DIAGNOSIS — S2242XA Multiple fractures of ribs, left side, initial encounter for closed fracture: Secondary | ICD-10-CM | POA: Diagnosis not present

## 2021-11-05 DIAGNOSIS — R0789 Other chest pain: Secondary | ICD-10-CM

## 2021-11-05 LAB — CBC
HCT: 33 % — ABNORMAL LOW (ref 36.0–46.0)
Hemoglobin: 10.8 g/dL — ABNORMAL LOW (ref 12.0–15.0)
MCH: 30.8 pg (ref 26.0–34.0)
MCHC: 32.7 g/dL (ref 30.0–36.0)
MCV: 94 fL (ref 80.0–100.0)
Platelets: 294 10*3/uL (ref 150–400)
RBC: 3.51 MIL/uL — ABNORMAL LOW (ref 3.87–5.11)
RDW: 13.5 % (ref 11.5–15.5)
WBC: 9 10*3/uL (ref 4.0–10.5)
nRBC: 0 % (ref 0.0–0.2)

## 2021-11-05 MED ORDER — IBUPROFEN 800 MG PO TABS: 800.0000 mg | ORAL_TABLET | Freq: Three times a day (TID) | ORAL | 0 refills | Status: DC | PRN

## 2021-11-05 MED ORDER — ONDANSETRON 4 MG PO TBDP: 4.0000 mg | ORAL_TABLET | Freq: Three times a day (TID) | ORAL | 0 refills | Status: DC | PRN

## 2021-11-05 NOTE — ED Provider Notes (Addendum)
[facility]    CSN: [id number] Arrival date & time: 11/05/21  1102      History   Chief Complaint Chief Complaint  Patient presents with   Fall    Rib pain    HPI Alicia Pruitt is a 31 y.o. female.    Fall   Here for 3-week history of left lower rib pain.  She fell onto a table about that time, and has not been improving.  She states it does hurt with certain movements and to take a deep breath.  She has felt some popping in that area occasionally  Also in the last 1 to 2 days she had noticed some nausea and then some vomiting.  She is thrown up about 3 times in the last 24 hours.  2 days ago she had some blood in her stool when she had a watery diarrhea stool.  She has had some intermittent dizziness.  She also has some hoarseness she is noted.  No fever or cough or congestion.  Last menstrual period was about 3 weeks ago  Past Medical History:  Diagnosis Date   Drug use    History of asthma    as a child   History of hay fever    History of headache    History of UTI     Patient Active Problem List   Diagnosis Date Noted   Smoking 07/06/2015   Heavy menses 07/05/2015   Encounter for routine gynecological examination 07/05/2015   Fatigue 07/05/2015   Right-sided chest wall pain 08/10/2014   Cough 08/10/2014   Dysuria 03/16/2013   Abdominal pain, epigastric 03/16/2013   Irregular menses 03/16/2013    History reviewed. No pertinent surgical history.  OB History   No obstetric history on file.      Home Medications    Prior to Admission medications   Medication Sig Start Date End Date Taking? Authorizing Provider  ibuprofen (ADVIL) 800 MG tablet Take 1 tablet (800 mg total) by mouth every 8 (eight) hours as needed (pain). 11/05/21  Yes Zenia Resides, MD  ondansetron (ZOFRAN-ODT) 4 MG disintegrating tablet Take 1 tablet (4 mg total) by mouth every 8 (eight) hours as needed for nausea or vomiting. 11/05/21  Yes Zenia Resides,  MD    Family History Family History  Problem Relation Age of Onset   Alcohol abuse Paternal Grandfather    Arthritis Paternal Grandfather        RA   Alcohol abuse Paternal Grandmother    Drug abuse Other        maternal aunts had drug problems   Colon cancer Other        maternal Great Aunt, Maternal Great Uncle   Breast cancer Maternal Grandmother    Hypertension Maternal Grandmother    Breast cancer Maternal Aunt    Hypertension Father    Diabetes type I Brother     Social History Social History   Tobacco Use   Smoking status: Every Day    Packs/day: 0.10    Types: Cigarettes   Smokeless tobacco: Never  Substance Use Topics   Alcohol use: Yes    Alcohol/week: 0.0 standard drinks of alcohol    Comment: "1/2 of a fifth" daily   Drug use: No     Allergies   Tramadol   Review of Systems Review of Systems   Physical Exam Triage Vital Signs ED Triage Vitals  Enc Vitals Group     BP 11/05/21 1145  126/88     Pulse Rate 11/05/21 1145 85     Resp 11/05/21 1145 15     Temp 11/05/21 1145 98.2 F (36.8 C)     Temp Source 11/05/21 1145 Oral     SpO2 11/05/21 1145 98 %     Weight --      Height --      Head Circumference --      Peak Flow --      Pain Score 11/05/21 1144 5     Pain Loc --      Pain Edu? --      Excl. in GC? --    No data found.  Updated Vital Signs BP 126/88 (BP Location: Left Arm)   Pulse 85   Temp 98.2 F (36.8 C) (Oral)   Resp 15   LMP 10/15/2021   SpO2 98%   Visual Acuity Right Eye Distance:   Left Eye Distance:   Bilateral Distance:    Right Eye Near:   Left Eye Near:    Bilateral Near:     Physical Exam Vitals reviewed.  Constitutional:      General: She is not in acute distress.    Appearance: She is not toxic-appearing.  HENT:     Right Ear: Tympanic membrane and ear canal normal.     Left Ear: Tympanic membrane and ear canal normal.     Nose: Nose normal.     Mouth/Throat:     Mouth: Mucous membranes are  moist.     Pharynx: No oropharyngeal exudate or posterior oropharyngeal erythema.  Eyes:     Extraocular Movements: Extraocular movements intact.     Conjunctiva/sclera: Conjunctivae normal.     Pupils: Pupils are equal, round, and reactive to light.  Cardiovascular:     Rate and Rhythm: Normal rate and regular rhythm.     Heart sounds: No murmur heard. Pulmonary:     Effort: Pulmonary effort is normal. No respiratory distress.     Breath sounds: No wheezing, rhonchi or rales.     Comments: Left lower ribs are tender. No rash there and no ecchymosis currently. Musculoskeletal:     Cervical back: Neck supple.  Lymphadenopathy:     Cervical: No cervical adenopathy.  Skin:    Capillary Refill: Capillary refill takes less than 2 seconds.     Coloration: Skin is not jaundiced or pale.  Neurological:     General: No focal deficit present.     Mental Status: She is alert and oriented to person, place, and time.  Psychiatric:        Behavior: Behavior normal.      UC Treatments / Results  Labs (all labs ordered are listed, but only abnormal results are displayed) Labs Reviewed  CBC    EKG   Radiology DG Ribs Unilateral W/Chest Left  Result Date: 11/05/2021 CLINICAL DATA:  Left chest pain, history of fall 3 weeks earlier EXAM: LEFT RIBS AND CHEST - 3+ VIEW COMPARISON:  08/10/2014 FINDINGS: Cardiac size is within normal limits. There are no focal pulmonary infiltrates. There is no pleural effusion or pneumothorax. Fracture is seen in the anterolateral aspect of left eighth rib. In 1 of the oblique views there is questionable minimal cortical irregularity in the left seventh and ninth ribs. Evaluation is limited by motion artifacts in the oblique view. IMPRESSION: There is recent fracture in the anterolateral aspect of left eighth rib. There are possible undisplaced fractures in the left seventh and ninth ribs.  There is no focal pulmonary contusion. There is no pleural effusion or  pneumothorax. Electronically Signed   By: Ernie Avena M.D.   On: 11/05/2021 12:54    Procedures Procedures (including critical care time)  Medications Ordered in UC Medications - No data to display  Initial Impression / Assessment and Plan / UC Course  I have reviewed the triage vital signs and the nursing notes.  Pertinent labs & imaging results that were available during my care of the patient were reviewed by me and considered in my medical decision making (see chart for details).     Xrays show fx of the eight rib, and poss also of the 7th and 9th ribs.  Will tx the pain and also zofran for the nausea. CBC today since she noted blood in the stool. Assistance requested to help her find a PCP  As she is 3 weeks s/p this injury, I am not going to rx a narcotic for her pain. Ibuprofen as needed, and she could also take tylenol.  I also think the blood in stool was due to the diarrhea/watery stool, and that the ibuprofen is safe for her to take Final Clinical Impressions(s) / UC Diagnoses   Final diagnoses:  Closed fracture of multiple ribs of left side, initial encounter  Gastroenteritis  Blood in stool     Discharge Instructions      Your x-rays show at least a fracture of the eighth rib, and possibly of the seventh and ninth ribs on the left.  Ondansetron dissolved in the mouth every 8 hours as needed for nausea or vomiting. Clear liquids and bland things to eat.  Take ibuprofen 800 mg--1 tab every 8 hours as needed for pain.       ED Prescriptions     Medication Sig Dispense Auth. Provider   ibuprofen (ADVIL) 800 MG tablet Take 1 tablet (800 mg total) by mouth every 8 (eight) hours as needed (pain). 21 tablet Choua Ikner, Janace Aris, MD   ondansetron (ZOFRAN-ODT) 4 MG disintegrating tablet Take 1 tablet (4 mg total) by mouth every 8 (eight) hours as needed for nausea or vomiting. 10 tablet Marlinda Mike Janace Aris, MD      I have reviewed the PDMP during this  encounter.   Zenia Resides, MD 11/05/21 1302    Zenia Resides, MD 11/05/21 1310    Zenia Resides, MD 11/05/21 1311    Zenia Resides, MD 11/05/21 1312

## 2021-11-05 NOTE — ED Triage Notes (Signed)
Pt fell 3 weeks ago hitting left ribs, c/o pains that are sharp with certain movements.  Reports week ago had bloody stool x 1. Reports SOB with exertion.  Repots voice is hoarse as well.

## 2021-11-05 NOTE — Discharge Instructions (Addendum)
Your x-rays show at least a fracture of the eighth rib, and possibly of the seventh and ninth ribs on the left.  Ondansetron dissolved in the mouth every 8 hours as needed for nausea or vomiting. Clear liquids and bland things to eat.  Take ibuprofen 800 mg--1 tab every 8 hours as needed for pain.

## 2021-11-08 ENCOUNTER — Encounter (HOSPITAL_COMMUNITY): Payer: Self-pay

## 2022-10-16 IMAGING — DX DG RIBS W/ CHEST 3+V*L*
3 series · 3 of 3 positions shown · non-contrast
Comparison: 08/10/2014

CLINICAL DATA: Left chest pain, history of fall 3 weeks earlier

EXAM:
LEFT RIBS AND CHEST - 3+ VIEW

[chest pa]
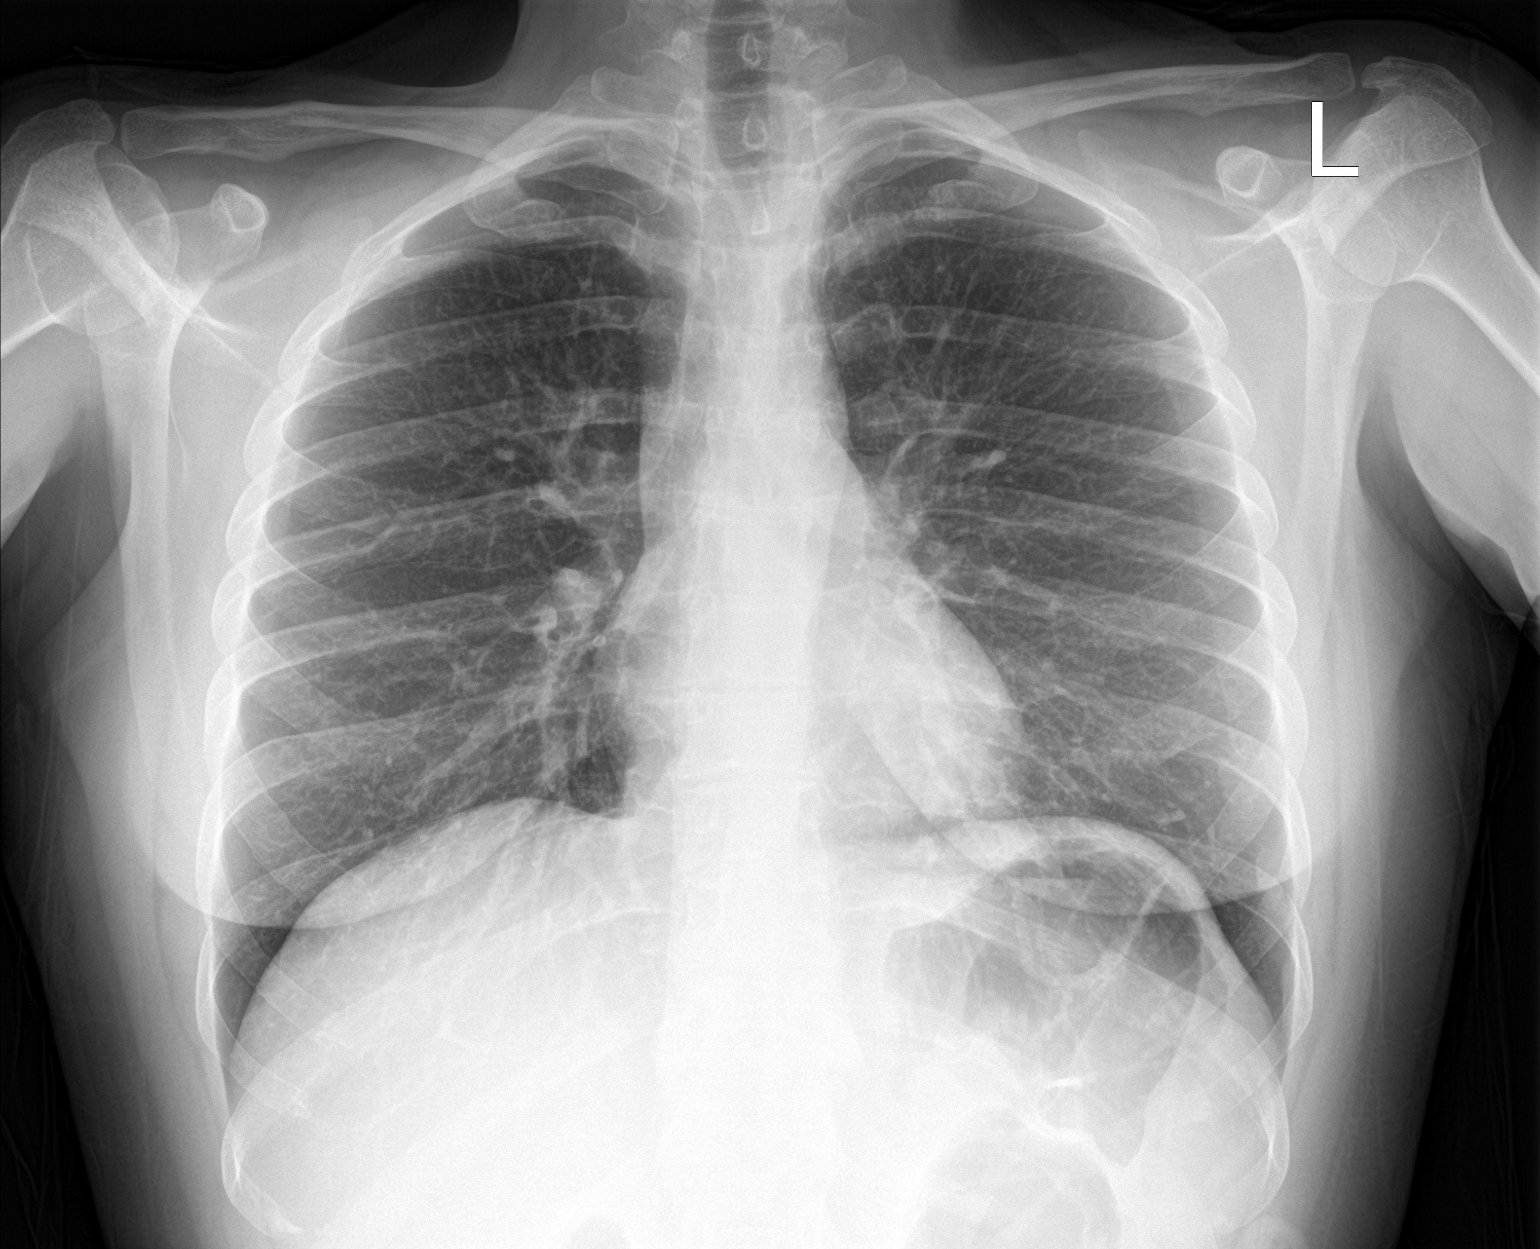

[rib obl (1 of 2)]
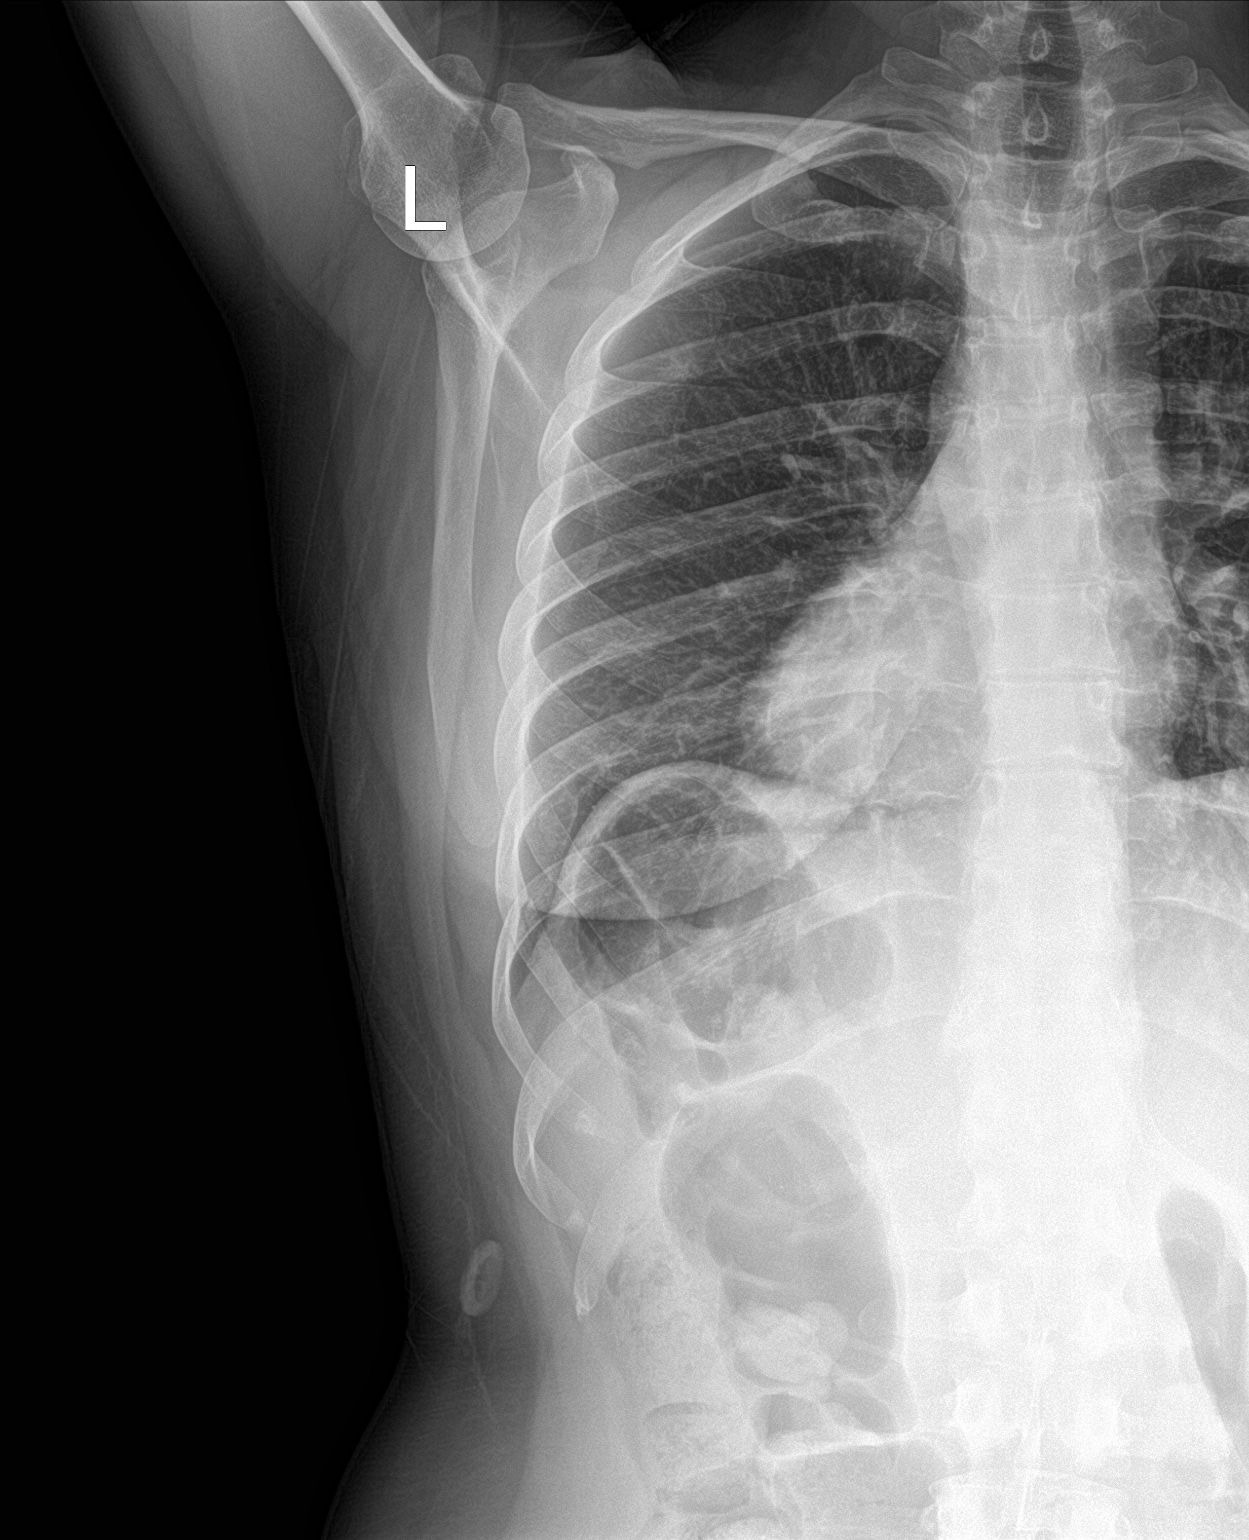

[rib obl (2 of 2)]
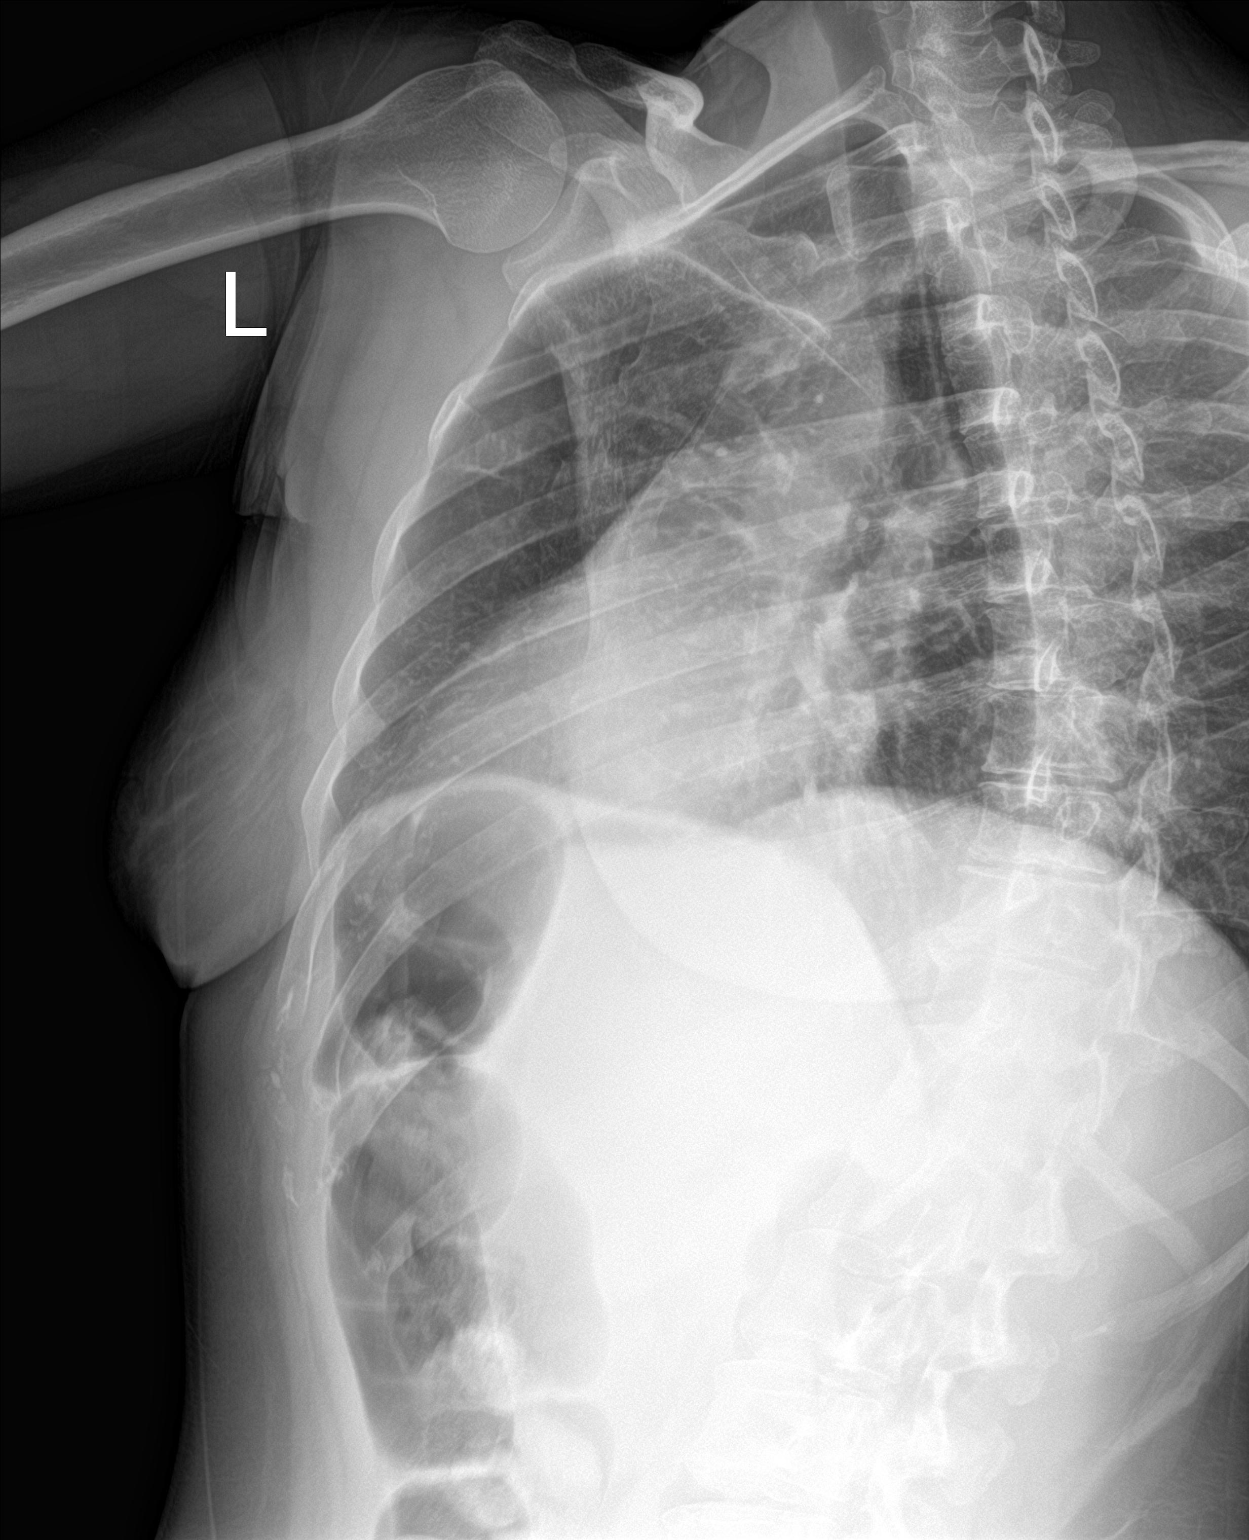

[3 of 3 positions shown; findings below may reference images not displayed]

FINDINGS: Cardiac size is within normal limits. There are no focal pulmonary
infiltrates. There is no pleural effusion or pneumothorax. Fracture
is seen in the anterolateral aspect of left eighth rib. In 1 of the
oblique views there is questionable minimal cortical irregularity in
the left seventh and ninth ribs. Evaluation is limited by motion
artifacts in the oblique view.
IMPRESSION: There is recent fracture in the anterolateral aspect of left eighth
rib. There are possible undisplaced fractures in the left seventh
and ninth ribs. There is no focal pulmonary contusion. There is no
pleural effusion or pneumothorax.

## 2023-03-06 ENCOUNTER — Emergency Department (HOSPITAL_COMMUNITY): Payer: 59

## 2023-03-06 ENCOUNTER — Other Ambulatory Visit: Payer: Self-pay

## 2023-03-06 ENCOUNTER — Emergency Department (HOSPITAL_COMMUNITY)
Admission: EM | Admit: 2023-03-06 | Discharge: 2023-03-07 | Disposition: A | Discharge status: Home / Self Care | Payer: 59 | Attending: Emergency Medicine | Admitting: Emergency Medicine

## 2023-03-06 DIAGNOSIS — S129XXA Fracture of neck, unspecified, initial encounter: Secondary | ICD-10-CM | POA: Insufficient documentation

## 2023-03-06 DIAGNOSIS — Z23 Encounter for immunization: Secondary | ICD-10-CM | POA: Diagnosis not present

## 2023-03-06 DIAGNOSIS — S81819A Laceration without foreign body, unspecified lower leg, initial encounter: Secondary | ICD-10-CM | POA: Insufficient documentation

## 2023-03-06 DIAGNOSIS — Y92410 Unspecified street and highway as the place of occurrence of the external cause: Secondary | ICD-10-CM | POA: Diagnosis not present

## 2023-03-06 DIAGNOSIS — S0181XA Laceration without foreign body of other part of head, initial encounter: Secondary | ICD-10-CM | POA: Insufficient documentation

## 2023-03-06 DIAGNOSIS — M542 Cervicalgia: Secondary | ICD-10-CM | POA: Diagnosis present

## 2023-03-06 LAB — CBC
HCT: 42.9 % (ref 36.0–46.0)
Hemoglobin: 13.8 g/dL (ref 12.0–15.0)
MCH: 29.2 pg (ref 26.0–34.0)
MCHC: 32.2 g/dL (ref 30.0–36.0)
MCV: 90.7 fL (ref 80.0–100.0)
Platelets: 157 10*3/uL (ref 150–400)
RBC: 4.73 MIL/uL (ref 3.87–5.11)
RDW: 12.8 % (ref 11.5–15.5)
WBC: 7.3 10*3/uL (ref 4.0–10.5)
nRBC: 0 % (ref 0.0–0.2)

## 2023-03-06 LAB — I-STAT CHEM 8, ED
BUN: 11 mg/dL (ref 6–20)
Calcium, Ion: 0.98 mmol/L — ABNORMAL LOW (ref 1.15–1.40)
Chloride: 104 mmol/L (ref 98–111)
Creatinine, Ser: 0.8 mg/dL (ref 0.44–1.00)
Glucose, Bld: 110 mg/dL — ABNORMAL HIGH (ref 70–99)
HCT: 41 % (ref 36.0–46.0)
Hemoglobin: 13.9 g/dL (ref 12.0–15.0)
Potassium: 5 mmol/L (ref 3.5–5.1)
Sodium: 138 mmol/L (ref 135–145)
TCO2: 25 mmol/L (ref 22–32)

## 2023-03-06 LAB — SAMPLE TO BLOOD BANK

## 2023-03-06 LAB — COMPREHENSIVE METABOLIC PANEL
ALT: 59 U/L — ABNORMAL HIGH (ref 0–44)
AST: 75 U/L — ABNORMAL HIGH (ref 15–41)
Albumin: 3.2 g/dL — ABNORMAL LOW (ref 3.5–5.0)
Alkaline Phosphatase: 60 U/L (ref 38–126)
Anion gap: 12 (ref 5–15)
BUN: 9 mg/dL (ref 6–20)
CO2: 23 mmol/L (ref 22–32)
Calcium: 8.6 mg/dL — ABNORMAL LOW (ref 8.9–10.3)
Chloride: 104 mmol/L (ref 98–111)
Creatinine, Ser: 0.69 mg/dL (ref 0.44–1.00)
GFR, Estimated: >60 mL/min (ref >60)
Glucose, Bld: 99 mg/dL (ref 70–99)
Potassium: 5.1 mmol/L (ref 3.5–5.1)
Sodium: 139 mmol/L (ref 135–145)
Total Bilirubin: 0.9 mg/dL (ref 0.3–1.2)
Total Protein: 6.3 g/dL — ABNORMAL LOW (ref 6.5–8.1)

## 2023-03-06 LAB — I-STAT CG4 LACTIC ACID, ED: Lactic Acid, Venous: 0.9 mmol/L (ref 0.5–1.9)

## 2023-03-06 LAB — PROTIME-INR
INR: 1 (ref 0.8–1.2)
Prothrombin Time: 13 s (ref 11.4–15.2)

## 2023-03-06 LAB — ETHANOL: Alcohol, Ethyl (B): <10 mg/dL (ref <10)

## 2023-03-06 LAB — HCG, QUANTITATIVE, PREGNANCY: hCG, Beta Chain, Quant, S: <1 m[IU]/mL (ref <5)

## 2023-03-06 MED ORDER — TETANUS-DIPHTH-ACELL PERTUSSIS 5-2.5-18.5 LF-MCG/0.5 IM SUSY
0.5000 mL | PREFILLED_SYRINGE | Freq: Once | INTRAMUSCULAR | Status: AC
  Administered 2023-03-06: 0.5 mL via INTRAMUSCULAR
  Filled 2023-03-06: qty 0.5

## 2023-03-06 MED ORDER — ONDANSETRON HCL 4 MG/2ML IJ SOLN
4.0000 mg | Freq: Four times a day (QID) | INTRAMUSCULAR | Status: DC | PRN
  Administered 2023-03-07: 4 mg via INTRAVENOUS
  Filled 2023-03-06: qty 2

## 2023-03-06 MED ORDER — MORPHINE SULFATE (PF) 4 MG/ML IV SOLN
4.0000 mg | Freq: Once | INTRAVENOUS | Status: AC
  Administered 2023-03-06: 4 mg via INTRAVENOUS
  Filled 2023-03-06: qty 1

## 2023-03-06 MED ORDER — LIDOCAINE-EPINEPHRINE (PF) 2 %-1:200000 IJ SOLN
20.0000 mL | Freq: Once | INTRAMUSCULAR | Status: AC
  Administered 2023-03-06: 20 mL via INTRADERMAL
  Filled 2023-03-06: qty 20

## 2023-03-06 NOTE — ED Notes (Signed)
Pt went to CT
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
## 2023-03-07 ENCOUNTER — Other Ambulatory Visit (HOSPITAL_COMMUNITY): Payer: 59

## 2023-03-07 ENCOUNTER — Emergency Department (HOSPITAL_COMMUNITY): Payer: 59

## 2023-03-07 DIAGNOSIS — S129XXA Fracture of neck, unspecified, initial encounter: Secondary | ICD-10-CM | POA: Diagnosis not present

## 2023-03-07 MED ORDER — MORPHINE SULFATE (PF) 4 MG/ML IV SOLN
4.0000 mg | Freq: Once | INTRAVENOUS | Status: AC
  Administered 2023-03-07: 4 mg via INTRAVENOUS
  Filled 2023-03-07: qty 1

## 2023-03-07 MED ORDER — OXYCODONE-ACETAMINOPHEN 5-325 MG PO TABS
1.0000 | ORAL_TABLET | Freq: Four times a day (QID) | ORAL | 0 refills | Status: DC | PRN
Start: 2023-03-07 — End: 2023-07-14

## 2023-03-07 MED ORDER — NAPROXEN 375 MG PO TABS: 375.0000 mg | ORAL_TABLET | Freq: Two times a day (BID) | ORAL | 0 refills | Status: DC

## 2023-03-07 MED ORDER — IOHEXOL 350 MG/ML SOLN
75.0000 mL | Freq: Once | INTRAVENOUS | Status: AC | PRN
  Administered 2023-03-07: 75 mL via INTRAVENOUS

## 2023-03-07 NOTE — ED Notes (Signed)
Went to MRI 

## 2023-03-07 NOTE — ED Provider Notes (Addendum)
Case reviewed with neurosurgery, Dr Jake Samples.  Pt can be discharged in a c collar.  Outpt follow up.  Plan reviewed with patient's mother who is at the bedside       Linwood Dibbles, MD 03/07/23 [phone]

## 2023-03-07 NOTE — ED Notes (Signed)
Gave patient water and crackers for PO challenge then will try to ambulate.

## 2023-03-07 NOTE — ED Notes (Signed)
Neurosurgery changed their mind, stopped PO challenge but patient did consume some.

## 2023-03-07 NOTE — Discharge Instructions (Signed)
Keep the cervical collar on at all times.  Follow-up with the neurosurgeon, call the office to schedule an appointment.

## 2023-03-12 ENCOUNTER — Encounter (HOSPITAL_COMMUNITY): Payer: Self-pay

## 2023-03-12 ENCOUNTER — Other Ambulatory Visit: Payer: Self-pay

## 2023-03-12 ENCOUNTER — Emergency Department (HOSPITAL_COMMUNITY): Payer: 59

## 2023-03-12 ENCOUNTER — Emergency Department (HOSPITAL_COMMUNITY)
Admission: EM | Admit: 2023-03-12 | Discharge: 2023-03-12 | Disposition: A | Discharge status: Home / Self Care | Payer: 59 | Attending: Emergency Medicine | Admitting: Emergency Medicine

## 2023-03-12 DIAGNOSIS — M5020 Other cervical disc displacement, unspecified cervical region: Secondary | ICD-10-CM | POA: Diagnosis not present

## 2023-03-12 DIAGNOSIS — R202 Paresthesia of skin: Secondary | ICD-10-CM

## 2023-03-12 LAB — PREGNANCY, URINE: Preg Test, Ur: NEGATIVE

## 2023-03-12 MED ORDER — PREDNISONE 10 MG PO TABS: 20.0000 mg | ORAL_TABLET | Freq: Every day | ORAL | 0 refills | Status: DC

## 2023-03-12 NOTE — Progress Notes (Signed)
Patient ID: Alicia Pruitt, female   DOB: [birth year], 32 y.o.   MRN: [id number] BP 103/64 (BP Location: Right Arm)   Pulse 82   Temp 98.5 F (36.9 C) (Oral)   Resp 17   Ht 5\' 6"  (1.676 m)   Wt 72.6 kg   LMP 03/07/2023 (Exact Date)   SpO2 99%   BMI 25.82 kg/m  Mri reviewed. 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 Our office will call tomorrow to arrange for definitive treatment.
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
## 2023-03-12 NOTE — Discharge Instructions (Addendum)
Please follow-up with a neurosurgeon in regards to her recent symptoms and ER visit.  You should receive a phone call from the neurosurgeon tomorrow to schedule a sooner appointment to be evaluated for your symptoms.  Your MRI does show that at the C6 level your disc is protruding onto your C7 nerve root causing your symptoms but otherwise the MRI was reassuring.  Please take the steroids I have prescribed for you as this will help with inflammation.  If symptoms change or worsen please return to ER.

## 2023-03-12 NOTE — ED Notes (Signed)
Patient transported to MRI 
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
## 2023-06-24 ENCOUNTER — Other Ambulatory Visit: Payer: Self-pay | Admitting: Neurological Surgery

## 2023-06-26 ENCOUNTER — Other Ambulatory Visit: Payer: Self-pay | Admitting: Neurological Surgery

## 2023-07-10 NOTE — Pre-Procedure Instructions (Signed)
Surgical Instructions   Your procedure is scheduled on July 15, 2023. Report to [facility] Main Entrance "A" at 5:30 A.M., then check in with the Admitting office. Any questions or running late day of surgery: call [phone]  Questions prior to your surgery date: call [phone], Monday-Friday, 8am-4pm. If you experience any cold or flu symptoms such as cough, fever, chills, shortness of breath, etc. between now and your scheduled surgery, please notify us at the above number.     Remember:  Do not eat or drink after midnight the night before your surgery    Take these medicines the morning of surgery with A SIP OF WATER: predniSONE (DELTASONE)    May take these medicines IF NEEDED: ondansetron (ZOFRAN-ODT)  oxyCODONE-acetaminophen (PERCOCET/ROXICET)    One week prior to surgery, STOP taking any Aspirin (unless otherwise instructed by your surgeon) Aleve, Naproxen, Ibuprofen, Motrin, Advil, Goody's, BC's, all herbal medications, fish oil, and non-prescription vitamins.                     Do NOT Smoke (Tobacco/Vaping) for 24 hours prior to your procedure.  If you use a CPAP at night, you may bring your mask/headgear for your overnight stay.   You will be asked to remove any contacts, glasses, piercing's, hearing aid's, dentures/partials prior to surgery. Please bring cases for these items if needed.    Patients discharged the day of surgery will not be allowed to drive home, and someone needs to stay with them for 24 hours.  SURGICAL WAITING ROOM VISITATION Patients may have no more than 2 support people in the waiting area - these visitors may rotate.   Pre-op nurse will coordinate an appropriate time for 1 ADULT support person, who may not rotate, to accompany patient in pre-op.  Children under the age of 75 must have an adult with them who is not the patient and must remain in the main waiting area with an adult.  If the patient needs to stay at the hospital during  part of their recovery, the visitor guidelines for inpatient rooms apply.  Please refer to the [facility] website for the visitor guidelines for any additional information.   If you received a COVID test during your pre-op visit  it is requested that you wear a mask when out in public, stay away from anyone that may not be feeling well and notify your surgeon if you develop symptoms. If you have been in contact with anyone that has tested positive in the last 10 days please notify you surgeon.      Pre-operative 5 CHG Bathing Instructions   You can play a key role in reducing the risk of infection after surgery. Your skin needs to be as free of germs as possible. You can reduce the number of germs on your skin by washing with CHG (chlorhexidine gluconate) soap before surgery. CHG is an antiseptic soap that kills germs and continues to kill germs even after washing.   DO NOT use if you have an allergy to chlorhexidine/CHG or antibacterial soaps. If your skin becomes reddened or irritated, stop using the CHG and notify one of our RNs at [phone].   Please shower with the CHG soap starting 4 days before surgery using the following schedule:     Please keep in mind the following:  DO NOT shave, including legs and underarms, starting the day of your first shower.   You may shave your face at any point before/day of  surgery.  Place clean sheets on your bed the day you start using CHG soap. Use a clean washcloth (not used since being washed) for each shower. DO NOT sleep with pets once you start using the CHG.   CHG Shower Instructions:  Wash your face and private area with normal soap. If you choose to wash your hair, wash first with your normal shampoo.  After you use shampoo/soap, rinse your hair and body thoroughly to remove shampoo/soap residue.  Turn the water OFF and apply about 3 tablespoons (45 ml) of CHG soap to a CLEAN washcloth.  Apply CHG soap ONLY FROM YOUR NECK DOWN TO  YOUR TOES (washing for 3-5 minutes)  DO NOT use CHG soap on face, private areas, open wounds, or sores.  Pay special attention to the area where your surgery is being performed.  If you are having back surgery, having someone wash your back for you may be helpful. Wait 2 minutes after CHG soap is applied, then you may rinse off the CHG soap.  Pat dry with a clean towel  Put on clean clothes/pajamas   If you choose to wear lotion, please use ONLY the CHG-compatible lotions that are listed below.  Additional instructions for the day of surgery: DO NOT APPLY any lotions, deodorants, cologne, or perfumes.   Do not bring valuables to the hospital. [facility] is not responsible for any belongings/valuables. Do not wear nail polish, gel polish, artificial nails, or any other type of covering on natural nails (fingers and toes) Do not wear jewelry or makeup Put on clean/comfortable clothes.  Please brush your teeth.  Ask your nurse before applying any prescription medications to the skin.     CHG Compatible Lotions   Aveeno Moisturizing lotion  Cetaphil Moisturizing Cream  Cetaphil Moisturizing Lotion  Clairol Herbal Essence Moisturizing Lotion, Dry Skin  Clairol Herbal Essence Moisturizing Lotion, Extra Dry Skin  Clairol Herbal Essence Moisturizing Lotion, Normal Skin  Curel Age Defying Therapeutic Moisturizing Lotion with Alpha Hydroxy  Curel Extreme Care Body Lotion  Curel Soothing Hands Moisturizing Hand Lotion  Curel Therapeutic Moisturizing Cream, Fragrance-Free  Curel Therapeutic Moisturizing Lotion, Fragrance-Free  Curel Therapeutic Moisturizing Lotion, Original Formula  Eucerin Daily Replenishing Lotion  Eucerin Dry Skin Therapy Plus Alpha Hydroxy Crme  Eucerin Dry Skin Therapy Plus Alpha Hydroxy Lotion  Eucerin Original Crme  Eucerin Original Lotion  Eucerin Plus Crme Eucerin Plus Lotion  Eucerin TriLipid Replenishing Lotion  Keri Anti-Bacterial Hand Lotion  Keri Deep  Conditioning Original Lotion Dry Skin Formula Softly Scented  Keri Deep Conditioning Original Lotion, Fragrance Free Sensitive Skin Formula  Keri Lotion Fast Absorbing Fragrance Free Sensitive Skin Formula  Keri Lotion Fast Absorbing Softly Scented Dry Skin Formula  Keri Original Lotion  Keri Skin Renewal Lotion Keri Silky Smooth Lotion  Keri Silky Smooth Sensitive Skin Lotion  Nivea Body Creamy Conditioning Oil  Nivea Body Extra Enriched Lotion  Nivea Body Original Lotion  Nivea Body Sheer Moisturizing Lotion Nivea Crme  Nivea Skin Firming Lotion  NutraDerm 30 Skin Lotion  NutraDerm Skin Lotion  NutraDerm Therapeutic Skin Cream  NutraDerm Therapeutic Skin Lotion  ProShield Protective Hand Cream  Provon moisturizing lotion  Please read over the following fact sheets that you were given.

## 2023-07-11 ENCOUNTER — Inpatient Hospital Stay (HOSPITAL_COMMUNITY)
Admission: RE | Admit: 2023-07-11 | Discharge: 2023-07-11 | Disposition: A | Discharge status: Home / Self Care | Payer: 59 | Source: Ambulatory Visit

## 2023-07-11 NOTE — Progress Notes (Signed)
Pt was a no show for her pre-admission appointment. Pt called with no answer and voicemail left with callback number.

## 2023-07-14 ENCOUNTER — Encounter (HOSPITAL_COMMUNITY): Payer: Self-pay | Admitting: Neurological Surgery

## 2023-07-14 ENCOUNTER — Other Ambulatory Visit: Payer: Self-pay

## 2023-07-14 NOTE — Pre-Procedure Instructions (Signed)
-------------    SDW INSTRUCTIONS given:  Your procedure is scheduled on 2/18.  Report to [facility] Main Entrance "A" at 05:30 A.M., and check in at the Admitting office.  Any questions or running late day of surgery: call [phone]    Remember:  Do not eat or drink after midnight the night before your surgery     Take these medicines the morning of surgery with A SIP OF WATER : NONE    As of today, STOP taking any Aspirin (unless otherwise instructed by your surgeon) Aleve, Naproxen, Ibuprofen, Motrin, Advil, Goody's, BC's, all herbal medications, fish oil, and all vitamins.   Do NOT Smoke (Tobacco/Vaping) 24 hours prior to your procedure  If you use a CPAP at night, you may bring all equipment for your overnight stay.     You will be asked to remove any contacts, glasses, piercing's, hearing aid's, dentures/partials prior to surgery. Please bring cases for these items if needed.     Patients discharged the day of surgery will not be allowed to drive home, and someone needs to stay with them for 24 hours.  SURGICAL WAITING ROOM VISITATION Patients may have no more than 2 support people in the waiting area - these visitors may rotate.   Pre-op nurse will coordinate an appropriate time for 1 ADULT support person, who may not rotate, to accompany patient in pre-op.  Children under the age of 47 must have an adult with them who is not the patient and must remain in the main waiting area with an adult.  If the patient needs to stay at the hospital during part of their recovery, the visitor guidelines for inpatient rooms apply.  Please refer to the [facility] website for the visitor guidelines for any additional information.   Special instructions:   [place]- Preparing For Surgery   Please follow these instructions carefully.   Shower the NIGHT BEFORE SURGERY and the MORNING OF SURGERY with DIAL Soap.   Pat yourself dry with a CLEAN TOWEL.  Wear CLEAN PAJAMAS to bed  the night before surgery  Place CLEAN SHEETS on your bed the night of your first shower and DO NOT SLEEP WITH PETS.   Additional instructions for the day of surgery: DO NOT APPLY any lotions, deodorants, cologne, or perfumes.   Do not wear jewelry or makeup Do not wear nail polish, gel polish, artificial nails, or any other type of covering on natural nails (fingers and toes) Do not bring valuables to the hospital. [facility] is not responsible for valuables/personal belongings. Put on clean/comfortable clothes.  Please brush your teeth.  Ask your nurse before applying any prescription medications to the skin.

## 2023-07-14 NOTE — Progress Notes (Signed)
PCP - denies Cardiologist - denies  PPM/ICD - denies   Chest x-ray - 03/06/23 EKG - denies (n/a) Stress Test - denies ECHO - denies Cardiac Cath - denies  CPAP - denies  DM- denies  ASA/Blood Thinner Instructions: n/a   ERAS Protcol - no, NPO  COVID TEST- n/a  Anesthesia review: no  Patient verbally denies any shortness of breath, fever, cough and chest pain during phone call       Questions were answered. Patient verbalized understanding of instructions.

## 2023-07-15 ENCOUNTER — Ambulatory Visit (HOSPITAL_COMMUNITY): Admission: RE | Admit: 2023-07-15 | Payer: 59 | Source: Home / Self Care | Admitting: Neurological Surgery

## 2023-07-15 ENCOUNTER — Encounter (HOSPITAL_COMMUNITY): Admission: RE | Payer: Self-pay | Source: Home / Self Care

## 2023-07-15 HISTORY — DX: Anemia, unspecified: D64.9

## 2023-07-15 HISTORY — DX: Attention-deficit hyperactivity disorder, unspecified type: F90.9

## 2023-07-15 SURGERY — ANTERIOR CERVICAL DECOMPRESSION/DISCECTOMY FUSION 1 LEVEL
Anesthesia: General

## 2023-12-29 ENCOUNTER — Ambulatory Visit: Admitting: Nurse Practitioner

## 2024-06-16 ENCOUNTER — Emergency Department
Admission: EM | Admit: 2024-06-16 | Discharge: 2024-06-16 | Disposition: A | Discharge status: Home / Self Care | Payer: MEDICAID | Attending: Emergency Medicine | Admitting: Emergency Medicine

## 2024-06-16 ENCOUNTER — Emergency Department: Payer: MEDICAID

## 2024-06-16 ENCOUNTER — Other Ambulatory Visit: Payer: Self-pay

## 2024-06-16 DIAGNOSIS — S62615A Displaced fracture of proximal phalanx of left ring finger, initial encounter for closed fracture: Secondary | ICD-10-CM | POA: Diagnosis not present

## 2024-06-16 DIAGNOSIS — W010XXA Fall on same level from slipping, tripping and stumbling without subsequent striking against object, initial encounter: Secondary | ICD-10-CM | POA: Diagnosis not present

## 2024-06-16 DIAGNOSIS — S6992XA Unspecified injury of left wrist, hand and finger(s), initial encounter: Secondary | ICD-10-CM | POA: Diagnosis present

## 2024-06-16 MED ORDER — OXYCODONE HCL 5 MG PO TABS: 5.0000 mg | ORAL_TABLET | Freq: Three times a day (TID) | ORAL | 0 refills | Status: AC | PRN

## 2024-06-16 MED ORDER — LIDOCAINE HCL (PF) 1 % IJ SOLN
10.0000 mL | Freq: Once | INTRAMUSCULAR | Status: AC
  Administered 2024-06-16: 10 mL
  Filled 2024-06-16: qty 10

## 2024-06-16 MED ORDER — OXYCODONE HCL 5 MG PO TABS
5.0000 mg | ORAL_TABLET | Freq: Once | ORAL | Status: AC
  Administered 2024-06-16: 5 mg via ORAL
  Filled 2024-06-16: qty 1

## 2024-06-16 NOTE — ED Notes (Signed)
Patient discharged at this time. Ambulated to lobby with independent and steady gait. Breathing unlabored speaking in full sentences. Verbalized understanding of all discharge, follow up, and medication teaching. Discharged homed with all belongings.   

## 2024-06-16 NOTE — ED Triage Notes (Signed)
 Pt reports she fell tonight landing on left hand, pt has deformity to left ring finger.

## 2024-06-16 NOTE — Discharge Instructions (Addendum)
 Take acetaminophen  650 mg and ibuprofen  400 mg every 6 hours for pain.  Take with food. Use ice and elevate to help with pain and swelling. Use oxycodone  for severe pain only.  Keep your hand in the splint until you are able to see Dr. Ezra.  Keep the splint dry.  Call Dr. Ezra in the morning to schedule follow-up appointment and see if surgery may be needed for this broken bone.  Thank you for choosing us  for your health care today!  Please see your primary doctor this week for a follow up appointment.   If you have any new, worsening, or unexpected symptoms call your doctor right away or come back to the emergency department for reevaluation.  It was my pleasure to care for you today.   Ginnie EDISON Cyrena, MD

## 2024-06-16 NOTE — ED Provider Notes (Signed)
 "  [facility] Provider Note    Event Date/Time   First MD Initiated Contact with Patient 06/16/24 [id number]     (approximate)   History   Finger Injury   HPI  Alicia Pruitt is a 34 y.o. female   Past medical history of no significant past medical history here with a left finger injury ring finger injury after slipping falling and bracing her fall with her hand.  No other injuries noted.  Independent Historian contributed to assessment above: She is here with her boyfriend who corroborates information above  External Medical Documents Reviewed: Previous outpatient notes      Physical Exam   Triage Vital Signs: ED Triage Vitals  Encounter Vitals Group     BP 06/16/24 0322 130/75     Girls Systolic BP Percentile --      Girls Diastolic BP Percentile --      Boys Systolic BP Percentile --      Boys Diastolic BP Percentile --      Pulse Rate 06/16/24 0321 90     Resp 06/16/24 0321 17     Temp 06/16/24 0321 98.2 F (36.8 C)     Temp src --      SpO2 06/16/24 0321 99 %     Weight 06/16/24 0320 160 lb (72.6 kg)     Height 06/16/24 0320 5' 6 (1.676 m)     Head Circumference --      Peak Flow --      Pain Score 06/16/24 0320 5     Pain Loc --      Pain Education --      Exclude from Growth Chart --     Most recent vital signs: Vitals:   06/16/24 0321 06/16/24 0322  BP:  130/75  Pulse: 90   Resp: 17   Temp: 98.2 F (36.8 C)   SpO2: 99%     General: Awake, no distress.  CV:  Good peripheral perfusion.  Resp:  Normal effort.  Abd:  No distention.  Other:  Deformity noted to the left ring finger.  Brisk cap refill.  Able to gingerly range.  Sensation intact.  The remainder of the upper extremity affected on the left side shows no bony tenderness she is able to range it each of the joints on the other fingers wrist elbow and shoulder.   ED Results / Procedures / Treatments   Labs (all labs ordered are listed, but only abnormal results  are displayed) Labs Reviewed - No data to display  RADIOLOGY I independently reviewed and interpreted hand x-ray and see left fourth digit fracture of the proximal phalanx I also reviewed radiologist's formal read.   PROCEDURES:  Critical Care performed: No  .Splint Application  Date/Time: 06/16/2024 6:45 AM  Performed by: Cyrena Mylar, MD Authorized by: Cyrena Mylar, MD   Consent:    Consent obtained:  Verbal   Consent given by:  Patient   Risks discussed:  Discoloration and numbness   Alternatives discussed:  Delayed treatment and no treatment Universal protocol:    Procedure explained and questions answered to patient or proxy's satisfaction: yes     Patient identity confirmed:  Verbally with patient Pre-procedure details:    Distal neurologic exam:  Normal   Distal perfusion: distal pulses strong and brisk capillary refill   Procedure details:    Location:  Hand   Hand location:  L hand   Splint type:  Ulnar gutter   Supplies:  Cotton padding, elastic bandage and fiberglass   Attestation: Splint applied and adjusted personally by me   Post-procedure details:    Distal neurologic exam:  Normal   Distal perfusion: brisk capillary refill     Procedure completion:  Tolerated   Post-procedure imaging: not applicable   .Reduction of fracture  Date/Time: 06/16/2024 6:46 AM  Performed by: Cyrena Mylar, MD Authorized by: Cyrena Mylar, MD  Consent: Verbal consent obtained Risks and benefits: risks, benefits and alternatives were discussed Consent given by: patient Patient understanding: patient states understanding of the procedure being performed Patient identity confirmed: verbally with patient Time out: Immediately prior to procedure a time out was called to verify the correct patient, procedure, equipment, support staff and site/side marked as required. Local anesthesia used: yes Anesthesia: digital block  Anesthesia: Local anesthesia used: yes Local Anesthetic:  lidocaine  1% without epinephrine  Anesthetic total: 5 mL  Sedation: Patient sedated: no  Patient tolerance: patient tolerated the procedure well with no immediate complications      MEDICATIONS ORDERED IN ED: Medications  lidocaine  (PF) (XYLOCAINE ) 1 % injection 10 mL (10 mLs Other Given by Other 06/16/24 0517)  oxyCODONE  (Oxy IR/ROXICODONE ) immediate release tablet 5 mg (5 mg Oral Given 06/16/24 0546)     IMPRESSION / MDM / ASSESSMENT AND PLAN / ED COURSE  I reviewed the triage vital signs and the nursing notes.                                Patient's presentation is most consistent with acute presentation with potential threat to life or bodily function.  Differential diagnosis includes, but is not limited to, finger fracture, tendon injury, neurovascular injury    MDM:    Fracture was reduced and splinted.  Neurovascular intact.  Will follow-up with hand surgeon.        FINAL CLINICAL IMPRESSION(S) / ED DIAGNOSES   Final diagnoses:  Closed displaced fracture of proximal phalanx of left ring finger, initial encounter     Rx / DC Orders   ED Discharge Orders          Ordered    oxyCODONE  (ROXICODONE ) 5 MG immediate release tablet  Every 8 hours PRN        06/16/24 0533             Note:  This document was prepared using Dragon voice recognition software and may include unintentional dictation errors.    Cyrena Mylar, MD 06/16/24 [phone]  "

## 2024-07-02 ENCOUNTER — Other Ambulatory Visit: Payer: Self-pay

## 2024-07-06 ENCOUNTER — Encounter: Admission: RE | Payer: Self-pay | Source: Home / Self Care

## 2024-07-06 ENCOUNTER — Ambulatory Visit: Admission: RE | Admit: 2024-07-06 | Payer: MEDICAID | Source: Home / Self Care
